# Patient Record
Sex: Male | Born: 1960
Health system: Southern US, Community
[De-identification: ages and names within clinical notes are randomized; demographics above are authoritative.]

## PROBLEM LIST (undated history)

## (undated) DIAGNOSIS — K219 Gastro-esophageal reflux disease without esophagitis: Secondary | ICD-10-CM

## (undated) DIAGNOSIS — M1990 Unspecified osteoarthritis, unspecified site: Secondary | ICD-10-CM

## (undated) DIAGNOSIS — D126 Benign neoplasm of colon, unspecified: Secondary | ICD-10-CM

## (undated) DIAGNOSIS — K649 Unspecified hemorrhoids: Secondary | ICD-10-CM

## (undated) DIAGNOSIS — K648 Other hemorrhoids: Secondary | ICD-10-CM

## (undated) DIAGNOSIS — K5790 Diverticulosis of intestine, part unspecified, without perforation or abscess without bleeding: Secondary | ICD-10-CM

## (undated) DIAGNOSIS — E785 Hyperlipidemia, unspecified: Secondary | ICD-10-CM

## (undated) DIAGNOSIS — K644 Residual hemorrhoidal skin tags: Secondary | ICD-10-CM

## (undated) DIAGNOSIS — I499 Cardiac arrhythmia, unspecified: Secondary | ICD-10-CM

## (undated) HISTORY — DX: Residual hemorrhoidal skin tags: K64.4

## (undated) HISTORY — DX: Cardiac arrhythmia, unspecified: I49.9

## (undated) HISTORY — DX: Diverticulosis of intestine, part unspecified, without perforation or abscess without bleeding: K57.90

## (undated) HISTORY — DX: Hyperlipidemia, unspecified: E78.5

## (undated) HISTORY — DX: Unspecified osteoarthritis, unspecified site: M19.90

## (undated) HISTORY — DX: Benign neoplasm of colon, unspecified: D12.6

## (undated) HISTORY — DX: Other hemorrhoids: K64.8

## (undated) HISTORY — DX: Gastro-esophageal reflux disease without esophagitis: K21.9

## (undated) HISTORY — DX: Unspecified hemorrhoids: K64.9

---

## 1994-12-22 HISTORY — PX: KNEE SURGERY: SHX244

## 1998-12-22 DIAGNOSIS — K649 Unspecified hemorrhoids: Secondary | ICD-10-CM

## 1998-12-22 HISTORY — DX: Unspecified hemorrhoids: K64.9

## 2000-01-09 ENCOUNTER — Encounter: Payer: Self-pay | Admitting: Emergency Medicine

## 2000-01-09 ENCOUNTER — Emergency Department (HOSPITAL_COMMUNITY): Admission: EM | Admit: 2000-01-09 | Discharge: 2000-01-09 | Payer: Self-pay | Admitting: Emergency Medicine

## 2001-04-08 ENCOUNTER — Emergency Department (HOSPITAL_COMMUNITY): Admission: EM | Admit: 2001-04-08 | Discharge: 2001-04-08 | Payer: Self-pay | Admitting: Emergency Medicine

## 2001-11-05 ENCOUNTER — Ambulatory Visit (HOSPITAL_COMMUNITY): Admission: RE | Admit: 2001-11-05 | Discharge: 2001-11-05 | Payer: Self-pay

## 2002-11-08 ENCOUNTER — Inpatient Hospital Stay (HOSPITAL_COMMUNITY): Admission: EM | Admit: 2002-11-08 | Discharge: 2002-11-10 | Payer: Self-pay | Admitting: Emergency Medicine

## 2002-11-08 ENCOUNTER — Encounter: Payer: Self-pay | Admitting: Emergency Medicine

## 2002-11-10 ENCOUNTER — Encounter: Payer: Self-pay | Admitting: Internal Medicine

## 2003-03-06 ENCOUNTER — Encounter: Payer: Self-pay | Admitting: Cardiology

## 2003-03-06 ENCOUNTER — Inpatient Hospital Stay (HOSPITAL_COMMUNITY): Admission: EM | Admit: 2003-03-06 | Discharge: 2003-03-07 | Payer: Self-pay | Admitting: Emergency Medicine

## 2003-03-07 ENCOUNTER — Encounter: Payer: Self-pay | Admitting: Cardiology

## 2003-07-21 ENCOUNTER — Emergency Department (HOSPITAL_COMMUNITY): Admission: EM | Admit: 2003-07-21 | Discharge: 2003-07-21 | Payer: Self-pay | Admitting: Emergency Medicine

## 2003-07-21 ENCOUNTER — Encounter: Payer: Self-pay | Admitting: Emergency Medicine

## 2003-07-21 ENCOUNTER — Encounter: Payer: Self-pay | Admitting: Cardiology

## 2004-10-23 ENCOUNTER — Ambulatory Visit: Payer: Self-pay | Admitting: Internal Medicine

## 2005-01-21 ENCOUNTER — Emergency Department (HOSPITAL_COMMUNITY): Admission: EM | Admit: 2005-01-21 | Discharge: 2005-01-21 | Payer: Self-pay | Admitting: Emergency Medicine

## 2005-02-17 ENCOUNTER — Ambulatory Visit: Payer: Self-pay | Admitting: Internal Medicine

## 2005-08-24 ENCOUNTER — Emergency Department (HOSPITAL_COMMUNITY): Admission: EM | Admit: 2005-08-24 | Discharge: 2005-08-24 | Payer: Self-pay | Admitting: Emergency Medicine

## 2005-12-22 DIAGNOSIS — I499 Cardiac arrhythmia, unspecified: Secondary | ICD-10-CM

## 2005-12-22 HISTORY — DX: Cardiac arrhythmia, unspecified: I49.9

## 2005-12-22 HISTORY — PX: CARDIOVERSION: SHX1299

## 2006-06-04 ENCOUNTER — Emergency Department (HOSPITAL_COMMUNITY): Admission: EM | Admit: 2006-06-04 | Discharge: 2006-06-04 | Payer: Self-pay | Admitting: Emergency Medicine

## 2006-10-16 ENCOUNTER — Emergency Department (HOSPITAL_COMMUNITY): Admission: EM | Admit: 2006-10-16 | Discharge: 2006-10-16 | Payer: Self-pay | Admitting: Emergency Medicine

## 2007-02-08 ENCOUNTER — Emergency Department (HOSPITAL_COMMUNITY): Admission: EM | Admit: 2007-02-08 | Discharge: 2007-02-08 | Payer: Self-pay | Admitting: Emergency Medicine

## 2007-12-01 ENCOUNTER — Emergency Department (HOSPITAL_COMMUNITY): Admission: EM | Admit: 2007-12-01 | Discharge: 2007-12-01 | Payer: Self-pay | Admitting: Emergency Medicine

## 2011-05-09 NOTE — H&P (Signed)
NAME:  Derek Bullock, Derek Bullock                       ACCOUNT NO.:  [id number]   MEDICAL RECORD NO.:  [id number]                   PATIENT TYPE:  INP   LOCATION:  1829                                 FACILITY:  [facility]   PHYSICIAN:  Cecil Cranker, M.D. [facility]         DATE OF BIRTH:  [birth year]   DATE OF ADMISSION:  11/08/2002  DATE OF DISCHARGE:                                HISTORY & PHYSICAL   HISTORY OF PRESENT ILLNESS:  Mr. Dirosa is a 50 year old, one and one-half  packs per day smoker with strong family history of coronary artery disease,  who presented with recurrent prolonged chest pain, palpitations, and  presyncope. The patient states that over the past few weeks, he has noted  recurrent palpitations from chest discomfort that is not necessarily  exertional. He noted prolonged chest pressure with precordial leads  associated with palpitations of about ten minutes associated with  presyncope. He also has some left thoracic pain. There is no history of  hypertension, diabetes mellitus, or hyperlipidemia.   ALLERGIES:  CODEINE.   MEDICATIONS:  None.   PAST MEDICAL HISTORY:  Left knee surgery.   SOCIAL HISTORY:  Lives in [place]. He is single. He is a Education administrator. Smokes  one to one-half packs of cigarettes per day. Drinks a six pack of beer two  to three times per week.   FAMILY HISTORY:  There is a very strong family history of coronary artery  disease. Grandfather died in his 45's of a heart attack. Father had coronary  artery disease. Father is 37 years of age and has had a permanent Pacemaker.   REVIEW OF SYSTEMS:  Unremarkable except as noted in the history of present  illness.   PHYSICAL EXAMINATION:  VITAL SIGNS: Blood pressure 114/69. Pulse 63,  respiratory rate 16, temperature 97.8.  GENERAL: In no distress.  HEENT: Unremarkable.  NECK: No bruits. No jugular venous distention.  LUNGS: Decreased breath sounds but no rales or rhonchi.  CARDIAC: No is a systolic  click but no murmur.  ABDOMEN: Unremarkable.  EXTREMITIES: Normal pulses bilaterally.   DIAGNOSTIC IMPRESSION:  Chest x-ray revealed no active disease. EKG reveals  possible septal myocardial infarction with biventricular conduction delay.   IMPRESSION:  1. Chest pain, palpitations and near syncope. Coronary artery disease to be     ruled out.  2. Possible old anterior septal myocardial infarction by EKG.  3. Tobacco abuse.  4. Early chronic obstructive pulmonary disease.   PLAN:  Will admit the patient. Treat with aspirin and beta blocker IV  Heparin. Serial enzymes and EKG's. Because of the symptoms of presyncope,  palpitations, chest pain and EKG suggesting possible old anterior septal  myocardial infarction, I suggested coronary angiography. The patient agrees  with this approach. I have discussed the risks with the patient and family.  Cecil Cranker, M.D. [facility]    EJL/MEDQ  D:  11/08/2002  T:  11/08/2002  Job:  [id number]

## 2011-05-09 NOTE — Consult Note (Signed)
NAME:  Derek Bullock, Derek Bullock                       ACCOUNT NO.:  [id number]   MEDICAL RECORD NO.:  [id number]                   PATIENT TYPE:  EMS   LOCATION:  MAJO                                 FACILITY:  [facility]   PHYSICIAN:  [place] Bing, M.D.               DATE OF BIRTH:  1961-09-18   DATE OF CONSULTATION:  07/21/2003  DATE OF DISCHARGE:  07/21/2003                                   CONSULTATION   PRIMARY CARDIOLOGIST:  Dr. Sherryl Manges.   HISTORY OF PRESENT ILLNESS:  A 50 year old gentleman referred for evaluation  of chest pain.  Derek Bullock was first seen by [place] 8 months ago when he was  admitted to [facility] with chest discomfort.  He underwent cardiac  catheterization at that time which demonstrated no coronary disease and  normal left ventricular systolic function.  He has had 1 or 2  echocardiograms since then, interpreted as either mild diffuse hypokinesis  or inferior wall hypokinesis.  He returned to the hospital in April of this  year with tachy palpitations and a brief syncopal spell.  ET evaluation  revealed a right atrial tachycardia that was apparently ablated.  He was  also started on beta blocker therapy.  He has had no recurrence of these  symptoms.   This morning, he noted the sudden onset of sharp left lateral chest  discomfort that is moderately severe.  There was radiation to the left upper  arm.  After a few minutes, the discomfort became dull and mild, its current  status.  He attempted no interventions to alleviate the discomfort.  There  is no association with movement.  He had brief lightheadedness but no  dyspnea, no diaphoresis.  He feels well at the present time.   PAST MEDICAL HISTORY:  Otherwise unremarkable.   ALLERGIES:  He has no true allergies but has had an adverse reaction to  CODEINE.   CURRENT MEDICATIONS:  Acebutelol, probably at a dose of 200 mg b.i.d.   SOCIAL HISTORY:  Longstanding history of cigarette smoking with a  total  consumption of approximately 30/pack/year.  Works as a Education administrator.  Has had  somewhat excessive use of alcohol but no true abuse syndrome.   FAMILY HISTORY:  Negative for coronary disease, but 2 family members have  had sudden death.   REVIEW OF SYSTEMS:  Chronic anxiety; intermittent arthralgias.  All other  systems negative.   PHYSICAL EXAMINATION:  Pleasant, slightly anxious appearing gentleman.  Temperature is 97, heart rate is 50 and regular, respirations 18, blood  pressure 120/70.  HEENT:  Anicteric sclerae.  NECK:  No jugular venous distention.  No carotid bruits.  ENDOCRINE:  No thyromegaly.  HEMATOPOIETIC:  No adenopathy.  SKIN:  No significant lesions.  LUNGS:  Clear.  CARDIAC:  Normal first and second heart sounds; 4th heart sound present.  ABDOMEN:  Soft and nontender; no organomegaly; no bruits.  EXTREMITIES:  No edema; normal distal pulses.  NEUROMUSCULAR:  Symmetric strength and tone.   LABORATORIES:  Initial basic laboratory studies are normal.  MB fractions  have ranged between 5.3 and 6.1.  Both Troponin and myoglobin have been  normal.  EKG:  Normal sinus bradycardial; delayed R-wave progression; rightward axis;  minimal nonspecific ST segment abnormalities; otherwise normal.   IMPRESSION:  Derek Bullock presents with really his first episode of chest  pain.  On review, his admission in November 2003 that led to cardiac  catheterization probably followed an episode of SVT.  With known coronary  arteries, the likelihood of a cardiac etiology is relatively low.  Coronary  spasm is a possibility.  Many other non-cardiac etiologies are at least as  important consideration.  A D-Dimer level, an echocardiogram are pending.  If negative, we will consider discharge from the emergency department with  close outpatient follow-up and sublingual nitroglycerine to be used on a  p.r.n. basis.  The patient will be carefully instructed to call us  immediately should  symptoms recur.                                               [place] Bing, M.D.    RR/MEDQ  D:  07/21/2003  T:  07/21/2003  Job:  [id number]

## 2011-05-09 NOTE — Consult Note (Signed)
NAME:  Derek Bullock, Derek Bullock                       ACCOUNT NO.:  [id number]   MEDICAL RECORD NO.:  [id number]                   PATIENT TYPE:  INP   LOCATION:  4731                                 FACILITY:  [facility]   PHYSICIAN:  Duke Salvia, M.D. [facility]           DATE OF BIRTH:  30-Jun-1961   DATE OF CONSULTATION:  03/06/2003  DATE OF DISCHARGE:                                   CONSULTATION   REASON FOR CONSULTATION:  Thank you very much for the privilege of seeing  this patient in consultation because of syncope.   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  They are now occurring about once a week.  They are strongly positive with diet and regularly associated with profound  post episode of fatigue.   This morning he was getting ready for work.  He had he same prodrome, felt  his heart pounding.  He could see it pounding through his chest that he  guessed at about 150-180 beats per minute.  The next thing he knew, he  awakened on the floor.  He remained lightheaded.  It persisted for about an  hour.  He called the office, and then came to the hospital.   Cardiac evaluation has included catheterization in November of this year  that was normal.  He has no hyperlipidemia, hypertension, or diabetes.  The  patient does smoke.  He does have a family history of heart disease as well  as a family history of sudden death.   SOCIAL HISTORY:  As outlined above.  He does use alcohol and a fair amount  of caffeine as well as tobacco.   REVIEW OF SYSTEMS:  Noted on the intake sheet dated today, and is not  recounted here.   PHYSICAL EXAMINATION:  GENERAL:  On examination, he is a middle-aged  Caucasian  male, in no acute distress.  VITAL SIGNS:  Blood pressure was 154/77, and at this point, it was noted as  131/75 with a pulse of 72.  HEENT:  Demonstrated no scleral icterus, no xanthomata.  The neck veins were  flat.  The carotids were brisk.  They were full bilaterally without bruits.  BACK:  Without kyphosis or scoliosis.  LUNGS:  Clear.  HEART:  Sounds were regular without murmurs or gallops.  ABDOMEN:  Soft with active bowel sounds, without no active pulsation, or  hepatomegaly.  PULSES:  Femoral pulses were 2+, distal pulses were intact.  EXTREMITIES:  There was no clubbing, cyanosis or edema.  NEUROLOGIC:  Exam was grossly normal.   STUDIES:  Electrocardiogram demonstrated sinus rhythm at 65, with intervals  of 0.15/0.09/0.38 with right  axis deviation and an R prime in lead V1.   IMPRESSION:  1. Recurrent tachy palpitations today with syncope.  2. Cardiac evaluation including:     A. Normal catheterization.     B. Normal ejection fraction.     C. Abnormal electrocardiogram as described above.  3. Social issues related to #1.   RECOMMENDATIONS:  We had a discussion regarding a conservative approach  which would be using an event recorder to try and clarify the mechanism of  this as an initial phase.  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  To this end, we discussed EPS and RF catheter ablation.  He is interested in proceeding with this.   In addition, however, given his abnormal electrocardiogram, we will obtain a  signal average ECG, as well, as consider flecainide infusion given the R  prime in lead V1.                                               Duke Salvia, M.D. [facility]    SCK/MEDQ  D:  03/06/2003  T:  03/07/2003  Job:  [id number]

## 2011-05-09 NOTE — H&P (Signed)
NAME:  Derek Bullock, Derek Bullock                       ACCOUNT NO.:  [id number]   MEDICAL RECORD NO.:  [id number]                   PATIENT TYPE:  INP   LOCATION:  1826                                 FACILITY:  [facility]   PHYSICIAN:  Salvadore Farber, M.D. [facility]         DATE OF BIRTH:  [birth year]   DATE OF ADMISSION:  03/06/2003  DATE OF DISCHARGE:                                HISTORY & PHYSICAL   CARDIOLOGIST:  Cecil Cranker, M.D. [facility]   PRIMARY CARE PHYSICIAN:  None.   CHIEF COMPLAINT:  Syncope.   HISTORY OF PRESENT ILLNESS:  The patient is a 50 year old gentleman with an  approximately six-month history of near-weekly tachypalpitations who  suffered syncope today.  He was hospitalized in November for evaluation of  these tachypalpitations.  That evaluation included an echocardiogram  demonstrating an EF of 45% with diffuse hypokinesis and no significant  valvular disease.  A cardiac catheterization performed also during that  hospitalization demonstrated normal coronary arteries, EF of 65%, and no  significant valvular disease.  He unfortunately failed to appear for a  planned outpatient evaluation including event monitor.   He has been feeling well of late.  This morning, he awoke continuing to feel  well.  While putting his clothes on, he noted tachypalpitations that lasted  for approximately 10 seconds before he abruptly lost consciousness.  He was  home alone and awoke lying on his back.  He suffered no injuries.  After  awakening, he had mild left-sided chest discomfort which subsequently  resolved.  While he has had presyncope with several prior episodes, this is  the first episode that he has suffered frank syncope.  He is now feeling  well.  The patient denies any exertional dyspnea, PND, orthopnea, edema.   PAST MEDICAL HISTORY:  None except as above and a prior knee surgery.   ALLERGIES:  CODEINE.   CURRENT MEDICATIONS:  None.   SOCIAL HISTORY:  The patient lives in  [place] alone.  He drinks 2-3  caffeinated beverages per day.  He smokes one pack a day of tobacco for 30  years.  He works as a Education administrator.  He drinks approximately a six-pack on  Thursday through Sunday evenings.   FAMILY HISTORY:  Mother is alive with diabetes.  Father is alive at 20 with  coronary disease and having had a permanent pacemaker.  A grandfather died  of unclear causes suddenly in his 30s.  An uncle died suddenly at age 70.   REVIEW OF SYSTEMS:  Negative in detail except as the above with the sole  exception of anxiety.   PHYSICAL EXAMINATION:  GENERAL:  This is a generally well-appearing thin man  in no distress.  VITAL SIGNS:  Heart rate 72, blood pressure 131/75, oxygen saturation of 99%  on room air.  NECK:  He has no jugular venous distention.  LUNGS:  Clear to auscultation and percussion bilaterally.  CARDIOVASCULAR:  He has a nondisplaced point of maximal cardiac impulse.  There is a regular rate and rhythm without murmur, rub, or gallop.  ABDOMEN:  Soft, nondistended, and nontender.  There is no  hepatosplenomegaly.  Bowel sounds are normal.  EXTREMITIES:  Warm without clubbing, cyanosis, edema, or ulceration. Carotid  pulses are 2+ bilaterally without bruits.  Femoral pulses are 2+ bilaterally  without bruits.  Dorsalis pedis pulses are 2+ bilaterally.   LABORATORY DATA:  Electrocardiogram demonstrates normal sinus rhythm with  normal intervals.  There is poor R-wave progression and J-point elevation in  V2-V4.  There is right axis deviation.  This electrocardiogram is unchanged  compared with one of November 08, 2002.   Laboratory studies are all pending at this time.   IMPRESSION AND RECOMMENDATIONS:  The patient had syncope in the setting of  clearcut arrhythmia as evidenced by his palpitations.  Prior cardiac  catheterization and echocardiogram differ as to whether his left ventricular  systolic function is normal.  He has no coronary or valvular  disease as  assessed in the November evaluation.  We will repeat echocardiogram to  assess left ventricular systolic function.  We will rule out myocardial  infarction though I think this is very unlikely.  We will observe on cardiac  monitor and have him assessed by electrophysiology service.                                               Salvadore Farber, M.D. [facility]    WED/MEDQ  D:  03/06/2003  T:  03/06/2003  Job:  [id number]

## 2011-05-09 NOTE — Discharge Summary (Signed)
NAME:  Derek Bullock, Derek Bullock                       ACCOUNT NO.:  [id number]   MEDICAL RECORD NO.:  [id number]                   PATIENT TYPE:  INP   LOCATION:  4731                                 FACILITY:  [facility]   PHYSICIAN:  Duke Salvia, M.D. [facility]           DATE OF BIRTH:  08-Apr-1961   DATE OF ADMISSION:  03/06/2003  DATE OF DISCHARGE:  03/07/2003                                 DISCHARGE SUMMARY   PRINCIPAL DIAGNOSIS:  Syncope.   HISTORY OF PRESENT ILLNESS:  This is a 50 year old gentleman with a past  medical history of chest pain.  In  November, 2003 he underwent a  catheterization which showed normal coronaries and an EF of 65%.  The  patient was admitted with tachy palpitations and chest pain.  He awoke  feeling well, developed tachy palpitations, became dizzy and woke up on the  floor.  Estimated onset of tachy palpitations plus syncope was approximately  10 seconds,  LOC for approximately 30 seconds to one minute.  Felt dizzy  upon awakening.  Has been having episodes of tachy palpitations for two to  three times per week for the past month.  Only other episode was in November  when he was admitted.  Denies any diaphoresis, positive shortness of breath  with episodes, positive chest pain or palpitations and slight chest pain  after a previous echocardiogram with an EF of 45%, hypokinesis in septal  distal, inferior distal, distal posterior and apical walls, stays  tachycardia, rhythm is irregular rhythm.  The patient was admitted and  underwent ED consultation.  He had a flecainide challenge and the flecainide  challenge was negative.  He underwent an EP study which showed inducible  right atrial tachycardia, questionable cause.  The patient was placed on  acebutolol 200 mg every 12 hours and recommended for a 30 day event  recorder.  The patient was discharged later that evening in stable condition  on acebutolol 200 mg every 12 hours, Tylenol one to two tablets every  four  to six hours as needed.   ACTIVITY:  No heavy lifting or strenuous activity for the next four days.  The patient was not to drive until seen by Dr. Graciela Husbands.   DIET:  Low fat, low salt, low cholesterol diet.   DISCHARGE INSTRUCTIONS:  He is to call if he develops a lump or any drainage  in his groin and he is to see Dr. Graciela Husbands on April 6 at 2:15 p.m.  The patient  stated that he did not have insurance or the funds to proceed with 30 day  event monitor.  This will be discussed with Dr. Graciela Husbands at his follow up  visit.     Chinita Pester, C.R.N.P. [facility]                 Duke Salvia, M.D. [facility]    DS/MEDQ  D:  03/07/2003  T:  03/08/2003  Job:  [id number]   cc:   Duke Salvia, M.D. [facility]

## 2011-05-09 NOTE — Discharge Summary (Signed)
NAME:  Derek Bullock, Derek Bullock                       ACCOUNT NO.:  [id number]   MEDICAL RECORD NO.:  [id number]                   PATIENT TYPE:  INP   LOCATION:  4731                                 FACILITY:  [facility]   PHYSICIAN:  Cecil Cranker, M.D. [facility]         DATE OF BIRTH:  [birth year]   DATE OF ADMISSION:  11/08/2002  DATE OF DISCHARGE:  11/10/2002                           DISCHARGE SUMMARY - REFERRING   PROCEDURE:  Coronary angiogram 11/09/02.   REASON FOR ADMISSION:  The patient is a 50 year old male, with no prior  history of heart disease, who presented to the emergency room with complaint  of progressive tachy palpitations (fluttering), associated with dyspnea  and near syncope.  Electrocardiogram on admission was suggestive of prior  anteroseptal myocardial infarction.  He was admitted for rule out of MI and  further diagnostic evaluation.  Please refer to dictated admission note for  full details.   LABORATORY DATA:  Peak CPK 343/15 (4.4%); troponin I 0.01 (x3).  Lipid  profile -- total cholesterol 141, triglyceride 48, HDL 63, LDL 58  (cholesterol/HDL ratio 2.2), TSH 2.72, alcohol level less than 5.  CBC  normal.  INR 0.8.  Sodium 141, potassium 4.2, glucose 89, BUN 13, creatinine  0.8.  Normal liver enzymes.  Negative urinalysis.   ADMISSION DIAGNOSIS:  No acute disease.   HOSPITAL COURSE:  The patient ruled out for myocardial infarction with  normal troponin I markers.  However, total CPK was elevated with BMB  fraction was only 4.4%.  In light of these findings, and the abnormal  electrocardiogram on admission, recommendation was to proceed with  diagnostic coronary angiography.   Cardiac catheterization performed the following day, by Dr. Andee Lineman (see  report for full details), revealed normal coronary arteries and normal left  ventricle.  Of note, Dr. Andee Lineman suggested that the EKG abnormalities could  be due to the vertical rotation of the patient's heart.   Subsequent workup consisted of a 2D echocardiogram (to be done at the time  of this dictation).  We will also pursue a complaint of tachy palpitations  and near syncope with an outpatient 30 day event monitor.   The patient was not on any medications prior to admission, and will be  discharged in a similar fashion.   MEDICATIONS AT DISCHARGE:  None.   INSTRUCTIONS:  No heavy lifting, driving x2 days; call the office if there  is any swelling/bleeding.   The patient is to stop smoking tobacco.   The patient will follow up with Dr. Andee Lineman in clinic on Thursday, 11/24/02 at  10:15 a.m.  Arrangements will be made through our office for placement of a  30 day event monitor.   DISCHARGE DIAGNOSES:  1. Noncardiac chest pain.     A. Elevated total CPK; normal troponin I markers.     B. Normal coronary angiogram.  2. Tachy palpitations.     A. Associated near syncope.  3. Tobacco.  4. Substance abuse.     Gene Serpe, P.A. [facility]                      E. Graceann Congress, M.D. [facility]    GS/MEDQ  D:  11/10/2002  T:  11/10/2002  Job:  [id number]

## 2011-05-09 NOTE — Cardiovascular Report (Signed)
NAME:  Derek Bullock, Derek Bullock                       ACCOUNT NO.:  [id number]   MEDICAL RECORD NO.:  [id number]                   PATIENT TYPE:  INP   LOCATION:  4731                                 FACILITY:  [facility]   PHYSICIAN:  Learta Codding, M.D. [facility]             DATE OF BIRTH:  [birth year]   DATE OF PROCEDURE:  11/09/2002  DATE OF DISCHARGE:                              CARDIAC CATHETERIZATION   PROCEDURE:  1. Left heart catheterization with selective coronary angiography.  2. Ventriculography.   DIAGNOSES:  1. No evidence for flow-limiting coronary artery disease.  2. Normal left ventricular systolic function.  3. No mitral regurgitation.   CARDIOLOGIST:  Learta Codding, M.D.   INDICATION:  The patient is a 50 year old male admitted with tachy  palpitations and substernal chest pain.  The patient had an abnormal  electrocardiogram suggestive of a septal wall MI.  He has been referred for  diagnostic cardiac catheterization to assess his coronary anatomy.   DESCRIPTION OF PROCEDURE:  After informed consent was obtained, the patient  was brought to the catheterization laboratory.  The right groin was  sterilely prepped and draped.  Lidocaine 1% was injected.  A 6-French  arterial sheath was placed using modified Seldinger technique.  Subsequently, 6-French JR4 and JL4 catheters were used to engage the left  and right coronary ostia, respectively.  Selective coronary angiography was  performed in various projections using manual injection of contrast.  A 6-  French angled pigtail catheter was placed in the left ventricular cavity.  Appropriate left-sided hemodynamics were also obtained.  Ventriculography  was performed in single plane ROA projection with power injection of  contrast.  At the termination of the procedure, all catheters and sheaths  were removed, and the patient was brought back to the holding area.  No  complications were encountered.  Adequate hemostasis was  provided.   FINDINGS:   HEMODYNAMICS:  Left ventricular pressure 101/9 mmHg, aortic pressure 101/75  mmHg.   VENTRICULOGRAPHY:  Ejection fraction 65% , no mitral regurgitation, no  segmental wall motion abnormality.   SELECTIVE CORONARY ANGIOGRAPHY:  1. The left main coronary artery was a large caliber vessel with no evidence     of flow-limiting disease.  2. The left anterior descending artery was a very large caliber vessel     wrapping around the entire apex and providing flow to the distal inferior     wall.  There was no evidence of flow-limiting coronary artery disease.  3. The circumflex coronary artery was small, but there was no evidence of     flow-limiting disease.  4. The right coronary artery was a very large caliber vessel terminating in     a posterior descending artery and four posterolateral branches that are     providing the entire lateral wall.    RECOMMENDATIONS:  No evidence of flow-limiting coronary artery disease.  Further workup for tachy palpitations.  EKG abnormalities may have been  explained by vertical rotation of patient's heart.                                                Learta Codding, M.D. [facility]    GED/MEDQ  D:  11/09/2002  T:  11/09/2002  Job:  [id number]   cc:   Kirkland Hun. Tery Sanfilippo, M.D.  [address]  [place] [address]  Fax: [phone]   E. Graceann Congress, M.D. [facility]

## 2011-09-29 LAB — BASIC METABOLIC PANEL
BUN: 8
CO2: 26
Creatinine, Ser: 0.8
GFR calc Af Amer: >60
GFR calc non Af Amer: >60
Potassium: 4.2
Sodium: 144

## 2011-09-29 LAB — DIFFERENTIAL
Basophils Relative: 0
Eosinophils Relative: 0
Lymphocytes Relative: 13
Lymphs Abs: 1.4
Monocytes Absolute: 0.5

## 2011-09-29 LAB — CBC
Hemoglobin: 14
MCHC: 35.2
RBC: 4.13 — ABNORMAL LOW
WBC: 10.8 — ABNORMAL HIGH

## 2012-09-22 ENCOUNTER — Encounter (HOSPITAL_COMMUNITY): Payer: Self-pay | Admitting: *Deleted

## 2012-09-22 ENCOUNTER — Emergency Department (HOSPITAL_COMMUNITY)
Admission: EM | Admit: 2012-09-22 | Discharge: 2012-09-22 | Disposition: A | Discharge status: Home / Self Care | Payer: Self-pay | Attending: Emergency Medicine | Admitting: Emergency Medicine

## 2012-09-22 DIAGNOSIS — K644 Residual hemorrhoidal skin tags: Secondary | ICD-10-CM

## 2012-09-22 DIAGNOSIS — Z885 Allergy status to narcotic agent status: Secondary | ICD-10-CM | POA: Insufficient documentation

## 2012-09-22 DIAGNOSIS — F17200 Nicotine dependence, unspecified, uncomplicated: Secondary | ICD-10-CM | POA: Insufficient documentation

## 2012-09-22 LAB — TYPE AND SCREEN
ABO/RH(D): A NEG
Antibody Screen: NEGATIVE

## 2012-09-22 LAB — COMPREHENSIVE METABOLIC PANEL
ALT: 14 U/L (ref 0–53)
AST: 21 U/L (ref 0–37)
CO2: 25 mEq/L (ref 19–32)
Potassium: 4.1 mEq/L (ref 3.5–5.1)

## 2012-09-22 LAB — URINALYSIS, ROUTINE W REFLEX MICROSCOPIC
Glucose, UA: NEGATIVE mg/dL
Ketones, ur: NEGATIVE mg/dL
Nitrite: NEGATIVE
Protein, ur: NEGATIVE mg/dL
pH: 6 (ref 5.0–8.0)

## 2012-09-22 LAB — PROTIME-INR
INR: 0.89 (ref 0.00–1.49)
Prothrombin Time: 12 seconds (ref 11.6–15.2)

## 2012-09-22 LAB — CBC WITH DIFFERENTIAL/PLATELET
Basophils Absolute: 0 10*3/uL (ref 0.0–0.1)
Basophils Relative: 0 % (ref 0–1)
Eosinophils Absolute: 0.1 10*3/uL (ref 0.0–0.7)
HCT: 42.3 % (ref 39.0–52.0)
Lymphs Abs: 2.4 10*3/uL (ref 0.7–4.0)
MCH: 33.8 pg (ref 26.0–34.0)
MCV: 97.2 fL (ref 78.0–100.0)
Monocytes Absolute: 0.8 10*3/uL (ref 0.1–1.0)
Platelets: 268 10*3/uL (ref 150–400)
RBC: 4.35 MIL/uL (ref 4.22–5.81)
RDW: 13.2 % (ref 11.5–15.5)

## 2012-09-22 LAB — APTT: aPTT: 27 seconds (ref 24–37)

## 2012-09-22 MED ORDER — DOCUSATE SODIUM 100 MG PO CAPS: 100.0000 mg | ORAL_CAPSULE | Freq: Two times a day (BID) | ORAL | Status: DC

## 2012-09-22 MED ORDER — PRAMOXINE HCL 1 % RE FOAM: RECTAL | Status: DC | PRN

## 2012-09-22 MED ORDER — SODIUM CHLORIDE 0.9 % IV SOLN
1000.0000 mL | INTRAVENOUS | Status: DC
  Administered 2012-09-22: 1000 mL via INTRAVENOUS

## 2012-09-22 MED ORDER — ONDANSETRON HCL 4 MG/2ML IJ SOLN
4.0000 mg | Freq: Once | INTRAMUSCULAR | Status: AC
  Administered 2012-09-22: 4 mg via INTRAVENOUS
  Filled 2012-09-22: qty 2

## 2012-09-22 NOTE — ED Provider Notes (Signed)
History     CSN: [id number]  Arrival date & time 09/22/12  1100   First MD Initiated Contact with Patient 09/22/12 1108      Chief Complaint  Patient presents with  . Rectal Bleeding     Patient is a 51 y.o. male presenting with hematochezia.  Rectal Bleeding  The current episode started today. The problem occurs frequently. The problem has been gradually worsening. The pain is mild. The stool is described as soft. Associated symptoms include anorexia and abdominal pain. Pertinent negatives include no fever, no vomiting and no difficulty breathing. There were no sick contacts.  Pt also feels something hanging out of this anus.  He has noticed dark blood and then bright blood at times.  He does have pain with bleeding.  He does not have a primary doctor and has not been able to see anyone for this problem.  The patient is mother states that in the past he did have hemorrhoidal banding but that was many years ago  History reviewed. No pertinent past medical history.  Past Surgical History  Procedure Date  . Knee surgery     No family history on file.  History  Substance Use Topics  . Smoking status: Current Every Day Smoker  . Smokeless tobacco: Not on file  . Alcohol Use: Yes     occ      Review of Systems  Constitutional: Negative for fever.  Gastrointestinal: Positive for abdominal pain, hematochezia and anorexia. Negative for vomiting.  All other systems reviewed and are negative.    Allergies  Codeine  Home Medications   None  BP 125/83  Pulse 113  Temp 98.1 F (36.7 C) (Oral)  Resp 18  SpO2 99%  Physical Exam  Nursing note and vitals reviewed. Constitutional: He appears well-developed and well-nourished. No distress.  HENT:  Head: Normocephalic and atraumatic.  Right Ear: External ear normal.  Left Ear: External ear normal.  Eyes: Conjunctivae normal are normal. Right eye exhibits no discharge. Left eye exhibits no discharge. No scleral icterus.    Neck: Neck supple. No tracheal deviation present.  Cardiovascular: Normal rate, regular rhythm and intact distal pulses.   Pulmonary/Chest: Effort normal and breath sounds normal. No stridor. No respiratory distress. He has no wheezes. He has no rales.  Abdominal: Soft. Bowel sounds are normal. He exhibits no distension. There is no tenderness. There is no rebound and no guarding.  Genitourinary:       External hemorrhoid, no active bleeding, no mass palpated on rectal exam  Musculoskeletal: He exhibits no edema and no tenderness.  Neurological: He is alert. He has normal strength. No sensory deficit. Cranial nerve deficit:  no gross defecits noted. He exhibits normal muscle tone. He displays no seizure activity. Coordination normal.  Skin: Skin is warm and dry. No rash noted.  Psychiatric: He has a normal mood and affect.    ED Course  Procedures (including critical care time)  Labs Reviewed  COMPREHENSIVE METABOLIC PANEL - Abnormal; Notable for the following:    Glucose, Bld 174 (*)     All other components within normal limits  URINALYSIS, ROUTINE W REFLEX MICROSCOPIC - Abnormal; Notable for the following:    Bilirubin Urine SMALL (*)     All other components within normal limits  CBC WITH DIFFERENTIAL  APTT  PROTIME-INR  TYPE AND SCREEN  OCCULT BLOOD, POC DEVICE  ABO/RH   No results found.   1. External hemorrhoid  MDM  Patient has an external hemorrhoid on exam. He has no evidence of active bleeding. I suspect that he is hemorrhoid is the most likely source of the intermittent bleeding he has had. However as I explained to the patient cannot exclude the possibility of some other etiology within the colon. He would benefit from an outpatient colonoscopy.         Celene Kras, MD 09/22/12 [phone]

## 2012-09-22 NOTE — ED Notes (Signed)
PT states that he has lower abdominal pain with some swelling and then rectal bleeding for one month that is getting worse

## 2012-09-22 NOTE — ED Notes (Signed)
Pt c/o lower abd pain along with rectal swelling and bleeding. Pt reports hx of hemorrhoids but reports this feels different. Pt reports sometimes he sees bright red blood other times it is darker, pt also reports he has experienced some blood clots. Pt reports he has also had a decreased appetite lately.

## 2012-10-04 ENCOUNTER — Emergency Department (HOSPITAL_COMMUNITY): Payer: Self-pay

## 2012-10-04 ENCOUNTER — Emergency Department (HOSPITAL_COMMUNITY)
Admission: EM | Admit: 2012-10-04 | Discharge: 2012-10-04 | Disposition: A | Discharge status: Home / Self Care | Payer: Self-pay | Attending: Emergency Medicine | Admitting: Emergency Medicine

## 2012-10-04 ENCOUNTER — Encounter (HOSPITAL_COMMUNITY): Payer: Self-pay | Admitting: Emergency Medicine

## 2012-10-04 DIAGNOSIS — F1010 Alcohol abuse, uncomplicated: Secondary | ICD-10-CM | POA: Insufficient documentation

## 2012-10-04 DIAGNOSIS — Z7982 Long term (current) use of aspirin: Secondary | ICD-10-CM | POA: Insufficient documentation

## 2012-10-04 DIAGNOSIS — K625 Hemorrhage of anus and rectum: Secondary | ICD-10-CM | POA: Insufficient documentation

## 2012-10-04 LAB — BASIC METABOLIC PANEL
Chloride: 101 mEq/L (ref 96–112)
Creatinine, Ser: 0.77 mg/dL (ref 0.50–1.35)
GFR calc non Af Amer: >90 mL/min (ref >90)
Glucose, Bld: 196 mg/dL — ABNORMAL HIGH (ref 70–99)
Potassium: 4.6 mEq/L (ref 3.5–5.1)

## 2012-10-04 LAB — CBC WITH DIFFERENTIAL/PLATELET
Eosinophils Relative: 1 % (ref 0–5)
HCT: 40.6 % (ref 39.0–52.0)
Hemoglobin: 14.3 g/dL (ref 13.0–17.0)
MCV: 98.8 fL (ref 78.0–100.0)
RBC: 4.11 MIL/uL — ABNORMAL LOW (ref 4.22–5.81)
RDW: 13.7 % (ref 11.5–15.5)

## 2012-10-04 LAB — HEPATIC FUNCTION PANEL
ALT: 15 U/L (ref 0–53)
AST: 23 U/L (ref 0–37)
Albumin: 3.8 g/dL (ref 3.5–5.2)
Alkaline Phosphatase: 59 U/L (ref 39–117)
Bilirubin, Direct: 0.1 mg/dL (ref 0.0–0.3)
Indirect Bilirubin: 0.3 mg/dL (ref 0.3–0.9)
Total Bilirubin: 0.4 mg/dL (ref 0.3–1.2)
Total Protein: 6.6 g/dL (ref 6.0–8.3)

## 2012-10-04 LAB — PROTIME-INR
INR: 0.93 (ref 0.00–1.49)
Prothrombin Time: 12.4 seconds (ref 11.6–15.2)

## 2012-10-04 LAB — APTT: aPTT: 27 seconds (ref 24–37)

## 2012-10-04 MED ORDER — IOHEXOL 300 MG/ML  SOLN
100.0000 mL | Freq: Once | INTRAMUSCULAR | Status: AC | PRN
  Administered 2012-10-04: 100 mL via INTRAVENOUS

## 2012-10-04 NOTE — ED Provider Notes (Signed)
CT results reviewed and discussed with Dr. Rulon Abide, and shared with patient and family--no acute intra-abdominal process noted--incidental finding of invaginated left ureterocele..  Patient is scheduled to start with the [facility] on November 1.  Anticipate referral to gastroenterology and urology once established.  Patient resting comfortably at present with family at bedside.  [name], NP 10/04/12 [phone]

## 2012-10-04 NOTE — ED Provider Notes (Signed)
History     CSN: [id number]  Arrival date & time 10/04/12  1143   First MD Initiated Contact with Patient 10/04/12 1330      Chief Complaint  Patient presents with  . Rectal Bleeding    (Consider location/radiation/quality/duration/timing/severity/associated sxs/prior treatment) HPI The patient presents with continued rectal bleeding. The patient has had this bleeding for the last 7 months. The patient states that he was seen here last week and several other times as well. The patient is heavy drinker. The patient states that he does not have constant bleeding but that he has it several times per day. The patient states that he has follow up with GI in a few weeks. The patient denies nausea, vomiting, weakness, chest pain, shortness of breath, headache, or diarrhea.  History reviewed. No pertinent past medical history.  Past Surgical History  Procedure Date  . Knee surgery     History reviewed. No pertinent family history.  History  Substance Use Topics  . Smoking status: Current Every Day Smoker  . Smokeless tobacco: Not on file  . Alcohol Use: Yes     occ      Review of Systems All other systems negative except as documented in the HPI. All pertinent positives and negatives as reviewed in the HPI.  Allergies  Codeine  Home Medications   Current Outpatient Rx  Name Route Sig Dispense Refill  . ASPIRIN 325 MG PO TABS Oral Take 325 mg by mouth daily.    Marland Kitchen DOCUSATE SODIUM 100 MG PO CAPS Oral Take 1 capsule (100 mg total) by mouth every 12 (twelve) hours. 60 capsule 0    BP 127/82  Pulse 80  Temp 98.5 F (36.9 C) (Oral)  Resp 16  SpO2 99%  Physical Exam  Constitutional: He appears well-developed and well-nourished. No distress.  HENT:  Head: Normocephalic and atraumatic.  Mouth/Throat: Oropharynx is clear and moist.  Cardiovascular: Normal rate, regular rhythm and normal heart sounds.  Exam reveals no gallop and no friction rub.   No murmur  heard. Pulmonary/Chest: Effort normal and breath sounds normal.  Abdominal: Soft. Normal appearance and bowel sounds are normal. There is no rigidity, no rebound and no guarding. No hernia.    Genitourinary: Prostate normal. Rectal exam shows external hemorrhoid.    ED Course  Procedures (including critical care time)  Labs Reviewed  BASIC METABOLIC PANEL - Abnormal; Notable for the following:    Glucose, Bld 196 (*)     All other components within normal limits  CBC WITH DIFFERENTIAL - Abnormal; Notable for the following:    RBC 4.11 (*)     MCH 34.8 (*)     All other components within normal limits  HEPATIC FUNCTION PANEL  APTT  PROTIME-INR     Patient to the CDU stable and CT scan due to his lower abdominal pain.  MDM  MDM Reviewed: vitals, nursing note and previous chart Reviewed previous: labs Interpretation: labs            Carlyle Dolly, PA-C 10/04/12 1544

## 2012-10-04 NOTE — ED Provider Notes (Signed)
Medical screening examination/treatment/procedure(s) were performed by non-physician practitioner and as supervising physician I was immediately available for consultation/collaboration.  Jones Skene, M.D.      Jones Skene, MD 10/04/12 2354

## 2012-10-04 NOTE — ED Notes (Signed)
Patient drinking CT contrast

## 2012-10-04 NOTE — ED Notes (Signed)
Pt sts lower abd pain with rectal bleeding x several days; pt sts seen here last week for same

## 2012-10-04 NOTE — ED Notes (Signed)
Patient transported to CT 

## 2012-10-05 NOTE — ED Provider Notes (Signed)
Medical screening examination/treatment/procedure(s) were performed by non-physician practitioner and as supervising physician I was immediately available for consultation/collaboration.   Chance Karam B. Ladarious Kresse, MD 10/05/12 1014 

## 2012-11-16 ENCOUNTER — Ambulatory Visit (INDEPENDENT_AMBULATORY_CARE_PROVIDER_SITE_OTHER): Payer: Self-pay | Admitting: Family Medicine

## 2012-11-16 ENCOUNTER — Encounter: Payer: Self-pay | Admitting: Family Medicine

## 2012-11-16 VITALS — BP 134/87 | HR 70 | Ht 73.0 in | Wt 146.7 lb

## 2012-11-16 DIAGNOSIS — F17200 Nicotine dependence, unspecified, uncomplicated: Secondary | ICD-10-CM

## 2012-11-16 DIAGNOSIS — Z720 Tobacco use: Secondary | ICD-10-CM

## 2012-11-16 DIAGNOSIS — F1010 Alcohol abuse, uncomplicated: Secondary | ICD-10-CM

## 2012-11-16 DIAGNOSIS — K625 Hemorrhage of anus and rectum: Secondary | ICD-10-CM

## 2012-11-16 NOTE — Patient Instructions (Addendum)
Thank you for coming in today, it was very nice to meet you As we discussed try to slowly cut back on your alcohol intake Continue stool softeners and fiber supplementation to see if this improves your rectal bleeding.  Once you have the orange card please call and i will place a referral to [facility] for you to have a colonoscopy.   Alcohol Problems Most adults who drink alcohol drink in moderation (not a lot) are at low risk for developing problems related to their drinking. However, all drinkers, including low-risk drinkers, should know about the health risks connected with drinking alcohol. RECOMMENDATIONS FOR LOW-RISK DRINKING  Drink in moderation. Moderate drinking is defined as follows:   Men - no more than 2 drinks per day.  Nonpregnant women - no more than 1 drink per day.  Over age 30 - no more than 1 drink per day. A standard drink is 12 grams of pure alcohol, which is equal to a 12 ounce bottle of beer or wine cooler, a 5 ounce glass of wine, or 1.5 ounces of distilled spirits (such as whiskey, brandy, vodka, or rum).  ABSTAIN FROM (DO NOT DRINK) ALCOHOL:  When pregnant or considering pregnancy.  When taking a medication that interacts with alcohol.  If you are alcohol dependent.  A medical condition that prohibits drinking alcohol (such as ulcer, liver disease, or heart disease). DISCUSS WITH YOUR CAREGIVER:  If you are at risk for coronary heart disease, discuss the potential benefits and risks of alcohol use: Light to moderate drinking is associated with lower rates of coronary heart disease in certain populations (for example, men over age 11 and postmenopausal women). Infrequent or nondrinkers are advised not to begin light to moderate drinking to reduce the risk of coronary heart disease so as to avoid creating an alcohol-related problem. Similar protective effects can likely be gained through proper diet and exercise.  Women and the elderly have smaller amounts of  body water than men. As a result women and the elderly achieve a higher blood alcohol concentration after drinking the same amount of alcohol.  Exposing a fetus to alcohol can cause a broad range of birth defects referred to as Fetal Alcohol Syndrome (FAS) or Alcohol-Related Birth Defects (ARBD). Although FAS/ARBD is connected with excessive alcohol consumption during pregnancy, studies also have reported neurobehavioral problems in infants born to mothers reporting drinking an average of 1 drink per day during pregnancy.  Heavier drinking (the consumption of more than 4 drinks per occasion by men and more than 3 drinks per occasion by women) impairs learning (cognitive) and psychomotor functions and increases the risk of alcohol-related problems, including accidents and injuries. CAGE QUESTIONS:   Have you ever felt that you should Cut down on your drinking?  Have people Annoyed you by criticizing your drinking?  Have you ever felt bad or Guilty about your drinking?  Have you ever had a drink first thing in the morning to steady your nerves or get rid of a hangover (Eye opener)? If you answered positively to any of these questions: You may be at risk for alcohol-related problems if alcohol consumption is:   Men: Greater than 14 drinks per week or more than 4 drinks per occasion.  Women: Greater than 7 drinks per week or more than 3 drinks per occasion. Do you or your family have a medical history of alcohol-related problems, such as:  Blackouts.  Sexual dysfunction.  Depression.  Trauma.  Liver dysfunction.  Sleep disorders.  Hypertension.  Chronic abdominal pain.  Has your drinking ever caused you problems, such as problems with your family, problems with your work (or school) performance, or accidents/injuries?  Do you have a compulsion to drink or a preoccupation with drinking?  Do you have poor control or are you unable to stop drinking once you have started?  Do you  have to drink to avoid withdrawal symptoms?  Do you have problems with withdrawal such as tremors, nausea, sweats, or mood disturbances?  Does it take more alcohol than in the past to get you high?  Do you feel a strong urge to drink?  Do you change your plans so that you can have a drink?  Do you ever drink in the morning to relieve the shakes or a hangover? If you have answered a number of the previous questions positively, it may be time for you to talk to your caregivers, family, and friends and see if they think you have a problem. Alcoholism is a chemical dependency that keeps getting worse and will eventually destroy your health and relationships. Many alcoholics end up dead, impoverished, or in prison. This is often the end result of all chemical dependency.  Do not be discouraged if you are not ready to take action immediately.  Decisions to change behavior often involve up and down desires to change and feeling like you cannot decide.  Try to think more seriously about your drinking behavior.  Think of the reasons to quit. WHERE TO GO FOR ADDITIONAL INFORMATION   The National Institute on Alcohol Abuse and Alcoholism (NIAAA)www.niaaa.nih.gov  [employer] on Alcoholism and Drug Dependence (NCADD)www.ncadd.org  American Society of Addiction Medicine (ASAM)www.https://anderson-johnson.com/ Document Released: 12/08/2005 Document Revised: 03/01/2012 Document Reviewed: 07/26/2008 [facility] Patient Information 2013 [place], [place].

## 2012-11-22 DIAGNOSIS — Z720 Tobacco use: Secondary | ICD-10-CM | POA: Insufficient documentation

## 2012-11-22 DIAGNOSIS — K625 Hemorrhage of anus and rectum: Secondary | ICD-10-CM | POA: Insufficient documentation

## 2012-11-22 DIAGNOSIS — F1010 Alcohol abuse, uncomplicated: Secondary | ICD-10-CM | POA: Insufficient documentation

## 2012-11-22 HISTORY — DX: Tobacco use: Z72.0

## 2012-11-22 NOTE — Progress Notes (Signed)
  Subjective:    Patient ID: Derek Bullock, male    DOB: [birth year], 51 y.o.   MRN: [id number]  HPI Pt here as new patient to establish care and to discuss  1. Rectal bleeding:  Reports rectal bleeding off and on over the last 8 months.  Has been seen in the ED and found to have external hemorrhoids and referred to GI.  He has not been seen by GI at this point.  He does endorse undesired weight loss of ~20-30 lbs over a 2 month period.  Using colace to keep BM soft. He denies abdominal pain, nausea, vomiting.    2. EtOH abuse:  His mother who acompanies him is concerned about his EtOH abuse.  Typically drinks 3-4 beers per night (24 oz beers).  He does not feel that he has a problem with drinking.  He has cut back in the past and does reports some "shakes" when trying to cut back.    Review of Systems Per HPI    Objective:   Physical Exam  Constitutional: No distress.       Thin male  HENT:  Head: Normocephalic and atraumatic.  Eyes: Conjunctivae normal are normal. No scleral icterus.  Neck: Neck supple. No thyromegaly present.  Cardiovascular: Normal rate, regular rhythm and normal heart sounds.   Pulmonary/Chest: Effort normal and breath sounds normal.  Abdominal: Soft. Bowel sounds are normal. He exhibits no distension. There is no tenderness.  Genitourinary:       Declined rectal   Musculoskeletal: Normal range of motion. He exhibits no edema.  Neurological: He is alert.          Assessment & Plan:

## 2012-11-22 NOTE — Assessment & Plan Note (Signed)
Counseled to quit 

## 2012-11-22 NOTE — Assessment & Plan Note (Signed)
CAGE criteria positive.  Did not want to discuss a whole lot today but briefly reviewed strategies to reduce EtOH use, advised not to stop abruptly.  Follow up once he has assistance card

## 2012-11-22 NOTE — Assessment & Plan Note (Addendum)
Possibly due to external hemorrhoids that were seen on previous exam.  Declined rectal today. Rectal bleeding with weight loss most concerning for malignancy.  He Is agreeable to referral to [facility] for colonoscopy once he has assistance card. Advised to call once he has this in place so I could go ahead and place referral.

## 2013-07-21 ENCOUNTER — Ambulatory Visit: Payer: Self-pay | Admitting: Family Medicine

## 2013-09-25 IMAGING — CT CT ABD-PELV W/ CM
2 of 5 series · 17 of 46 positions shown, 19 images · IV contrast (APPLIED)
Comparison: None.

CLINICAL DATA: Abdominal pain, rectal bleeding

CT ABDOMEN AND PELVIS WITH CONTRAST
TECHNIQUE: Multidetector CT imaging of the abdomen and pelvis was
performed following the standard protocol during bolus
administration of intravenous contrast.
Contrast: 100mL OMNIPAQUE IOHEXOL 300 MG/ML  SOLN in

[Series 2: abd/pelv with 5.0 b31f st · axial · 0.67mm/px · z∈[-574,-194]mm · 14 of 86 slices shown, 16 images]
[im 5/86  soft-tissue]
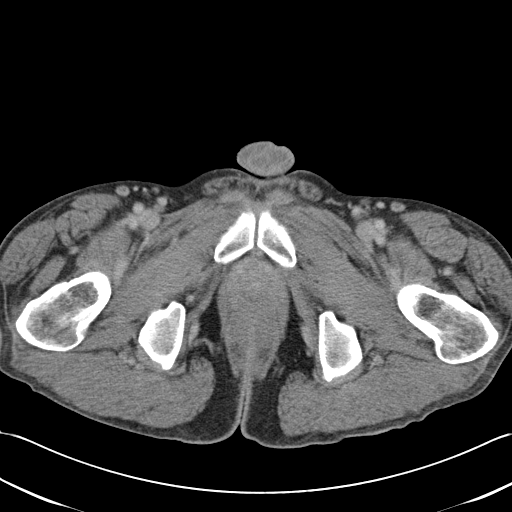
[im 5/86  bone]
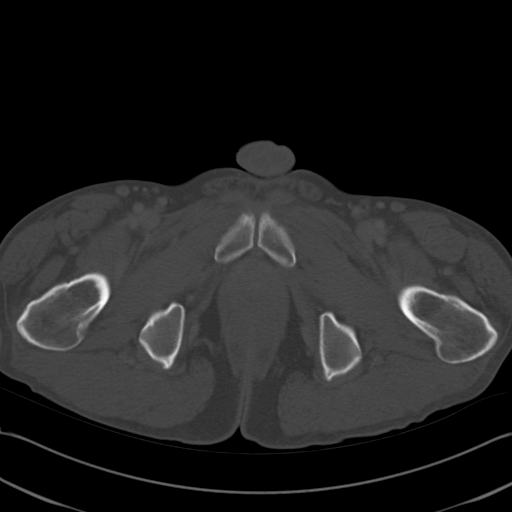
[im 9/86  soft-tissue]
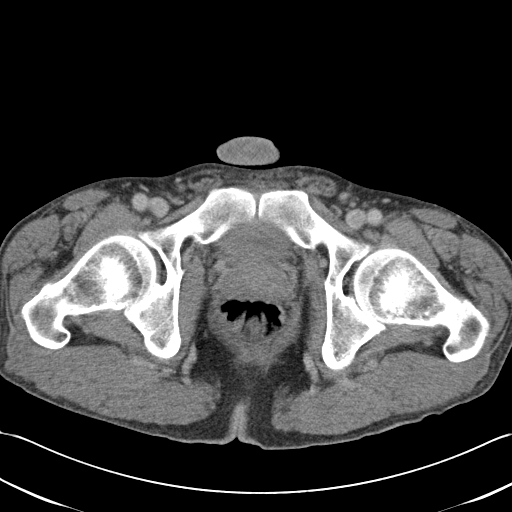
[im 18/86  soft-tissue]
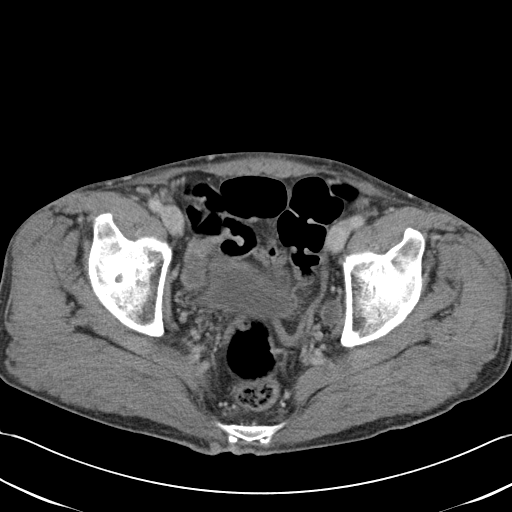
[im 23/86  soft-tissue]
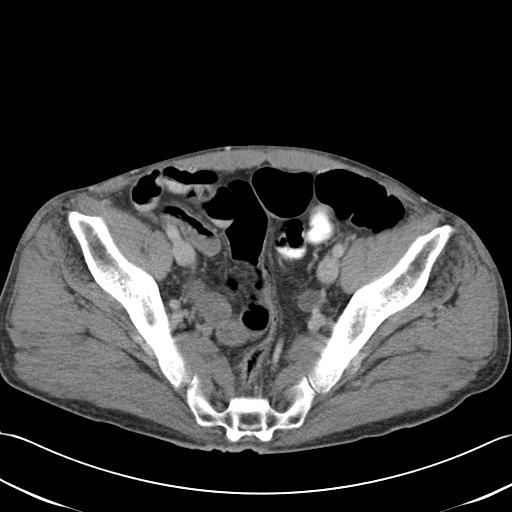
[im 27/86  soft-tissue]
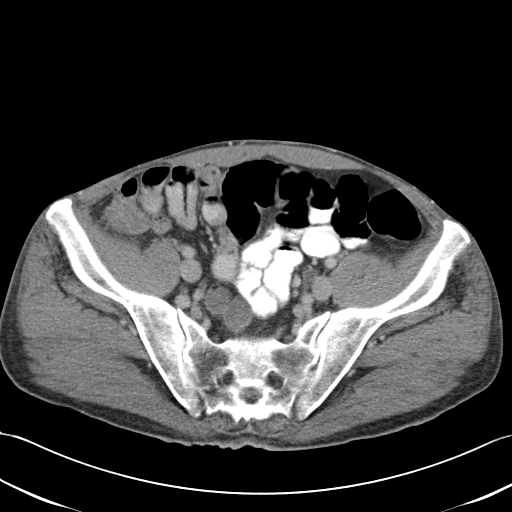
[im 36/86  soft-tissue]
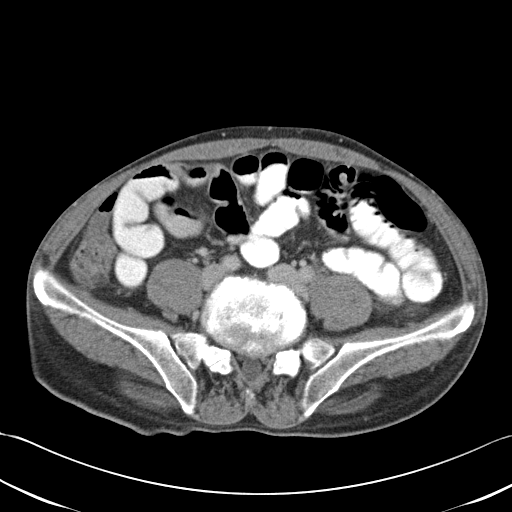
[im 41/86  soft-tissue]
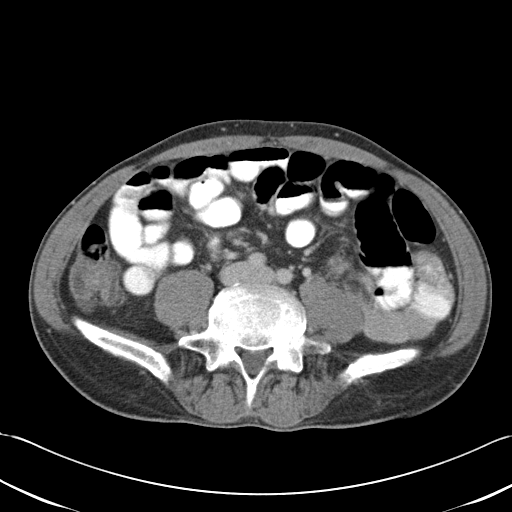
[im 45/86  soft-tissue]
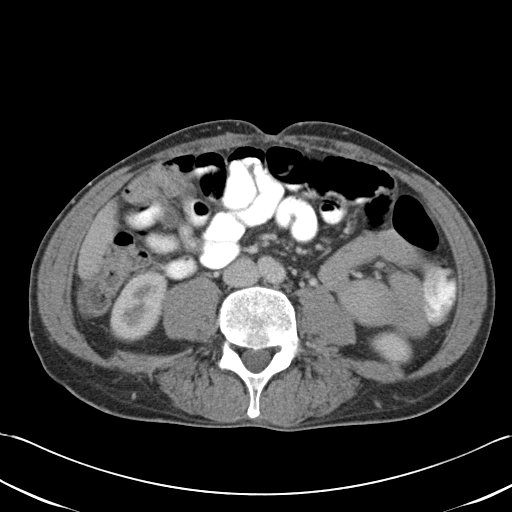
[im 50/86  soft-tissue]
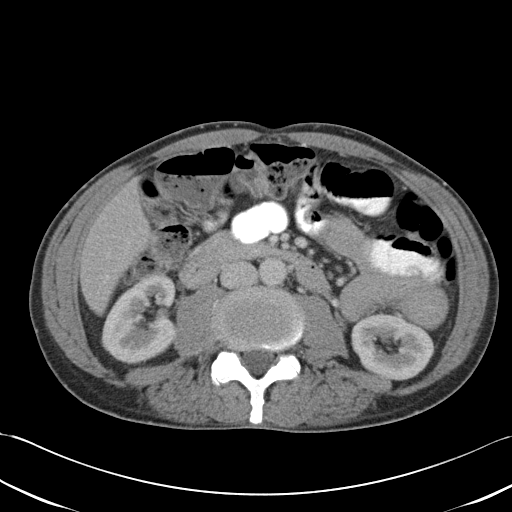
[im 50/86  bone]
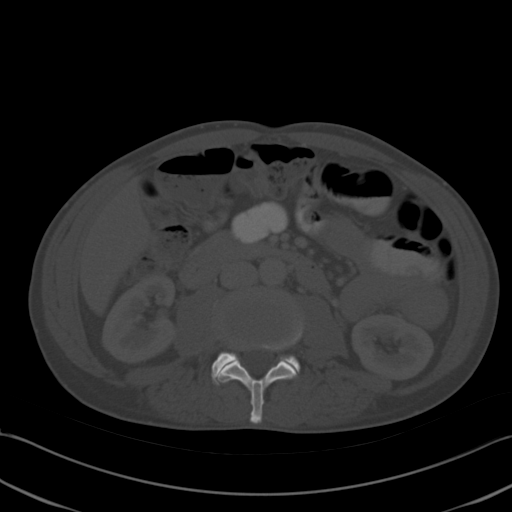
[im 59/86  soft-tissue]
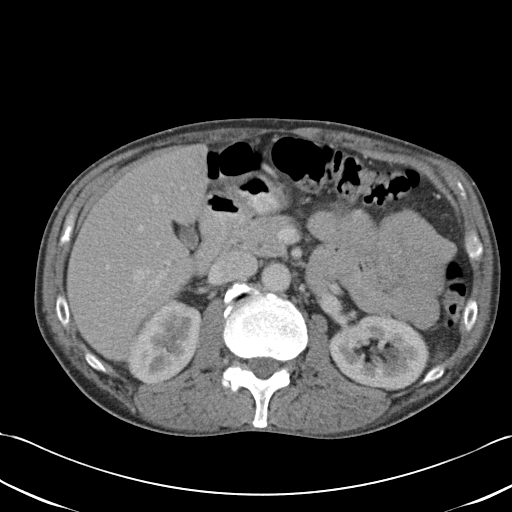
[im 63/86  soft-tissue]
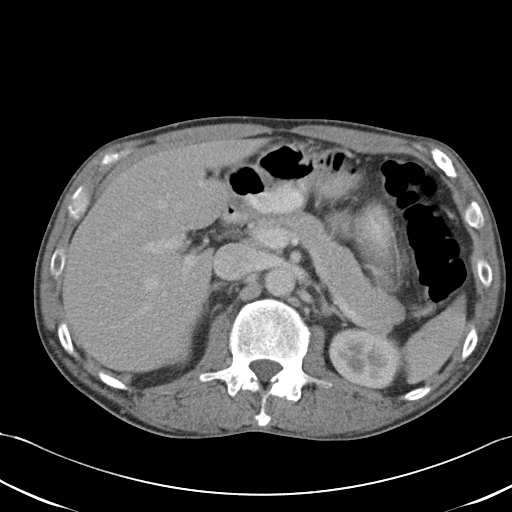
[im 68/86  soft-tissue]
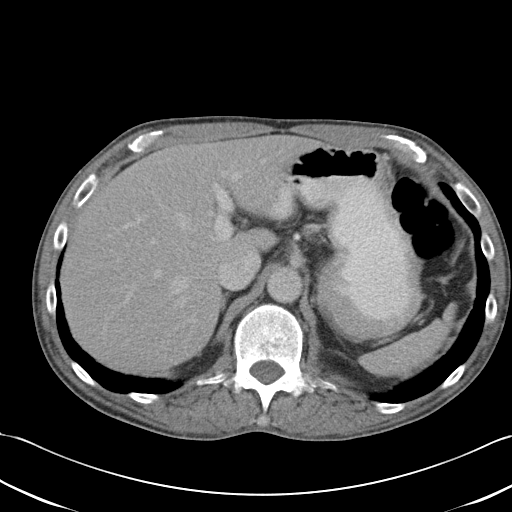
[im 77/86  soft-tissue]
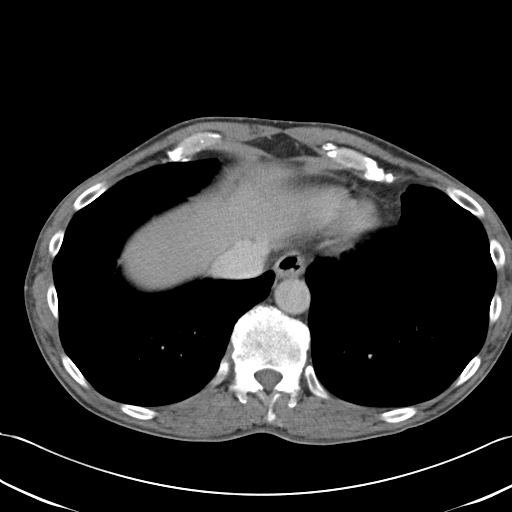
[im 81/86  soft-tissue]
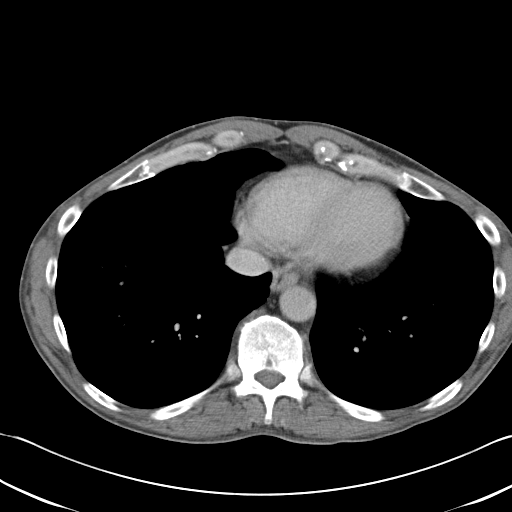

[Series 602: cor · coronal · 0.83mm/px · 3 of 66 slices shown]
[im 22/66  soft-tissue]
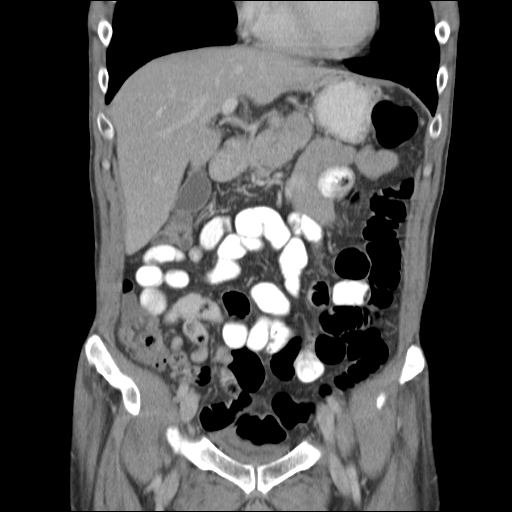
[im 29/66  soft-tissue]
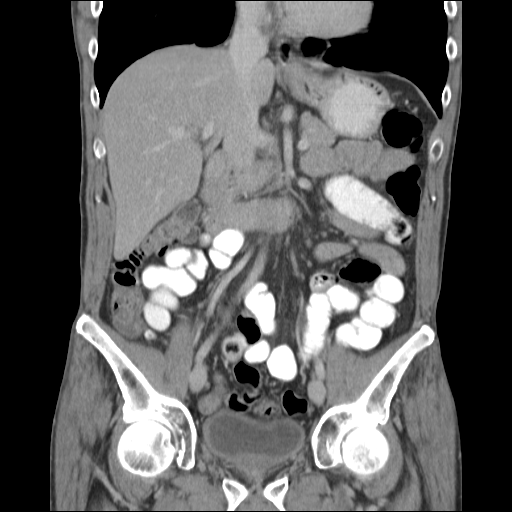
[im 37/66  soft-tissue]
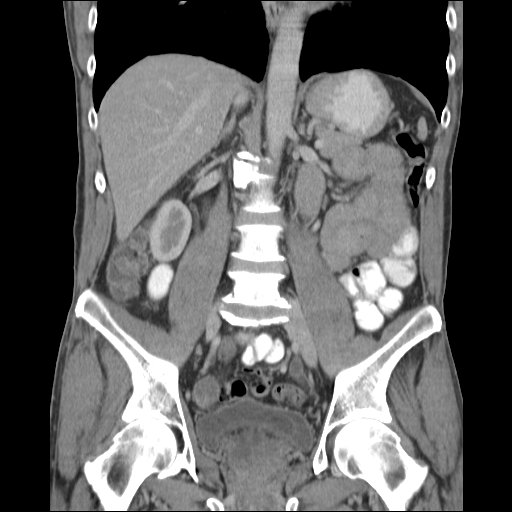

[17 of 46 positions shown; findings below may reference images not displayed]

FINDINGS: Lung bases clear.  Normal heart size.  No pericardial or
pleural effusion.  No hiatal hernia.

Abdomen:  Liver, gallbladder, biliary system, pancreas, spleen,
adrenal glands, and kidneys are within normal limits and
demonstrate no acute finding.  No obstruction or hydronephrosis.

Negative for bowel obstruction, dilatation, ileus pattern, or free
air.

No abdominal free fluid, fluid collection, hemorrhage, abscess, or
adenopathy.

Pelvis:  Left distal ureter is mildly dilated extending to the UVJ,
where there appears to be an invaginated ureterocele measuring
cm extending into the bladder lumen, image 72.  No associated
ureteral duplication identified or obstructing urinary tract
calculus.

No pelvic free fluid, fluid collection, hemorrhage, hematoma,
adenopathy, or inguinal abnormality.

Degenerative changes of the spine diffusely.  No compression
fracture.
IMPRESSION: No acute intra-abdominal pelvic process demonstrated.

Invaginating left ureterocele with mild left distal hydroureter but
no obstructive uropathy.  This likely is an incidental finding.

## 2015-04-30 ENCOUNTER — Emergency Department (HOSPITAL_COMMUNITY)
Admission: EM | Admit: 2015-04-30 | Discharge: 2015-04-30 | Discharge status: Against Medical Advice | Payer: Self-pay | Attending: Emergency Medicine | Admitting: Emergency Medicine

## 2015-04-30 ENCOUNTER — Encounter (HOSPITAL_COMMUNITY): Payer: Self-pay | Admitting: Cardiology

## 2015-04-30 DIAGNOSIS — N63 Unspecified lump in breast: Secondary | ICD-10-CM | POA: Insufficient documentation

## 2015-04-30 DIAGNOSIS — Z720 Tobacco use: Secondary | ICD-10-CM | POA: Insufficient documentation

## 2015-04-30 NOTE — ED Notes (Signed)
Pt reports that he noticed a lump under the right breast about a week ago. Pt reports the area is tender,.

## 2015-04-30 NOTE — ED Notes (Signed)
Pt updated on longest wait time, pt states, "I am not able to stay."

## 2015-05-01 ENCOUNTER — Encounter (HOSPITAL_COMMUNITY): Payer: Self-pay | Admitting: *Deleted

## 2015-05-01 ENCOUNTER — Emergency Department (HOSPITAL_COMMUNITY)
Admission: EM | Admit: 2015-05-01 | Discharge: 2015-05-01 | Disposition: A | Discharge status: Home / Self Care | Payer: Self-pay | Attending: Emergency Medicine | Admitting: Emergency Medicine

## 2015-05-01 DIAGNOSIS — N63 Unspecified lump in unspecified breast: Secondary | ICD-10-CM

## 2015-05-01 DIAGNOSIS — Z720 Tobacco use: Secondary | ICD-10-CM | POA: Insufficient documentation

## 2015-05-01 DIAGNOSIS — Z7982 Long term (current) use of aspirin: Secondary | ICD-10-CM | POA: Insufficient documentation

## 2015-05-01 NOTE — ED Notes (Signed)
Pt in c/o lump noted immediately under his right nipple x1 month, denies discharge from nipple, no distress noted, no redness noted

## 2015-05-01 NOTE — Discharge Instructions (Signed)
Follow up with Dr. Donne Hazel for further evaluation of your breast mass. Call the office today for an appointment.

## 2015-05-01 NOTE — ED Provider Notes (Signed)
CSN: [id number]     Arrival date & time 05/01/15  1050 History   This chart was scribed for Alvina Chou, PA-C working with Milton Ferguson, MD by Randa Evens, ED Scribe. This patient was seen in room TR11C/TR11C and the patient's care was started at 11:58 AM.    Chief Complaint  Patient presents with  . Breast Mass   The history is provided by the patient. No language interpreter was used.   HPI Comments: Derek Bullock is a 54 y.o. male who presents to the Emergency Department complaining of worsening breast mass under the right breast that began 1 month prior. Pt states that he lived at [place] in [phone] and was recently told that the water there was contaminated and caused breast cancer in males. Pt doesn't report any alleviating or worsening factors. Pt doesn't report any treatments tried PTA. Pt denies redness, warmth or discharge.    History reviewed. No pertinent past medical history. Past Surgical History  Procedure Laterality Date  . Knee surgery     History reviewed. No pertinent family history. History  Substance Use Topics  . Smoking status: Current Every Day Smoker -- 1.00 packs/day    Types: Cigarettes  . Smokeless tobacco: Not on file  . Alcohol Use: Yes     Comment: occ    Review of Systems  Respiratory:       Right sided breast mass.   All other systems reviewed and are negative.    Allergies  Codeine  Home Medications   Prior to Admission medications   Medication Sig Start Date End Date Taking? Authorizing Provider  aspirin 325 MG tablet Take 325 mg by mouth daily.    Historical Provider, MD  docusate sodium (COLACE) 100 MG capsule Take 1 capsule (100 mg total) by mouth every 12 (twelve) hours. 09/22/12   Dorie Rank, MD   BP 137/83 mmHg  Pulse 85  Temp(Src) 98.4 F (36.9 C) (Oral)  Resp 17  Ht 6\' 1"  (1.854 m)  Wt 155 lb (70.308 kg)  BMI 20.45 kg/m2  SpO2 98%   Physical Exam  Constitutional: He is oriented to person, place, and  time. He appears well-developed and well-nourished. No distress.  HENT:  Head: Normocephalic and atraumatic.  Eyes: Conjunctivae and EOM are normal.  Neck: Normal range of motion. Neck supple. No tracheal deviation present.  Cardiovascular: Normal rate.   Pulmonary/Chest: Effort normal. No respiratory distress. Right breast exhibits mass.  1.5 cm x 1.5 cm hard non tender mass just below the right nipple.  Abdominal: Soft. He exhibits no distension. There is no tenderness. There is no rebound.  Musculoskeletal: Normal range of motion.  Neurological: He is alert and oriented to person, place, and time. Coordination normal.  Skin: Skin is warm and dry.  Psychiatric: He has a normal mood and affect. His behavior is normal.  Nursing note and vitals reviewed.   ED Course  Procedures (including critical care time) DIAGNOSTIC STUDIES: Oxygen Saturation is 98% on RA, normal by my interpretation.    COORDINATION OF CARE: 12:07 PM-Discussed treatment plan with pt at bedside and pt agreed to plan.     Labs Review Labs Reviewed - No data to display  Imaging Review No results found.   EKG Interpretation None      MDM   Final diagnoses:  Breast mass in male    1:20 PM Patient will be referred to breast center for further evaluation. Vitals stable and patient afebrile.  I personally performed the services described in this documentation, which was scribed in my presence. The recorded information has been reviewed and is accurate.      Alvina Chou, PA-C 05/01/15 1321  Milton Ferguson, MD 05/01/15 1424

## 2015-05-01 NOTE — ED Notes (Signed)
Pt has 2 cm nodule under right nipple. Onset 1 month ago. States it is mildly tender to touch but if he "hits it, it hurts really bad". No drainage from nipple.

## 2015-05-11 ENCOUNTER — Encounter: Payer: Self-pay | Admitting: Family Medicine

## 2015-05-11 ENCOUNTER — Ambulatory Visit: Payer: Self-pay | Attending: Family Medicine | Admitting: Family Medicine

## 2015-05-11 VITALS — BP 129/86 | HR 80 | Temp 98.5°F | Resp 16 | Ht 72.0 in | Wt 148.0 lb

## 2015-05-11 DIAGNOSIS — N6310 Unspecified lump in the right breast, unspecified quadrant: Secondary | ICD-10-CM

## 2015-05-11 DIAGNOSIS — N62 Hypertrophy of breast: Secondary | ICD-10-CM

## 2015-05-11 DIAGNOSIS — K649 Unspecified hemorrhoids: Secondary | ICD-10-CM

## 2015-05-11 DIAGNOSIS — Z114 Encounter for screening for human immunodeficiency virus [HIV]: Secondary | ICD-10-CM

## 2015-05-11 DIAGNOSIS — Z720 Tobacco use: Secondary | ICD-10-CM

## 2015-05-11 DIAGNOSIS — N63 Unspecified lump in breast: Secondary | ICD-10-CM

## 2015-05-11 HISTORY — DX: Hypertrophy of breast: N62

## 2015-05-11 LAB — COMPLETE METABOLIC PANEL WITH GFR
ALBUMIN: 4.3 g/dL (ref 3.5–5.2)
ALT: 19 U/L (ref 0–53)
AST: 23 U/L (ref 0–37)
Alkaline Phosphatase: 69 U/L (ref 39–117)
BUN: 10 mg/dL (ref 6–23)
CALCIUM: 10.1 mg/dL (ref 8.4–10.5)
CHLORIDE: 104 meq/L (ref 96–112)
CO2: 27 meq/L (ref 19–32)
Creat: 0.86 mg/dL (ref 0.50–1.35)
GFR, Est African American: >89 mL/min
GLUCOSE: 86 mg/dL (ref 70–99)
Potassium: 5.2 mEq/L (ref 3.5–5.3)
Sodium: 141 mEq/L (ref 135–145)
TOTAL PROTEIN: 7 g/dL (ref 6.0–8.3)
Total Bilirubin: 0.5 mg/dL (ref 0.2–1.2)

## 2015-05-11 MED ORDER — MAGNESIUM HYDROXIDE 400 MG/5ML PO SUSP: 15.0000 mL | Freq: Every day | ORAL | Status: DC | PRN

## 2015-05-11 NOTE — Progress Notes (Signed)
Subjective:    Patient ID: Derek Bullock, male    DOB: 1961/06/16, 54 y.o.   MRN: [id number] CC: ED f/u, establish care, breast mass  HPI  1. R breast mass: x 1 month. Under nipple. Tender to palpation. Otherwise non tender. No nipple discharge. ROS as per below. Patient is a heavy smoker. Patient is a heavy drinker of beer.   2. Hemorrhoid: patient with pain after BMs with blood mixed with stool. Has hx of hemorrhoid banding. Banding helped for many years but pain and blood returned 2+ years ago.   History   Social History  . Marital Status: Divorced    Spouse Name: N/A  . Number of Children: 0  . Years of Education: 11   Occupational History  . Unemployed    Social History Main Topics  . Smoking status: Current Every Day Smoker -- 1.00 packs/day for 12 years    Types: Cigarettes  . Smokeless tobacco: Never Used  . Alcohol Use: Yes     Comment: 80 0z of beer 3 times a week   . Drug Use: No  . Sexual Activity: Not on file   Other Topics Concern  . Not on file   Social History Narrative   Mom and brother    Fam Hx: heart disease in mother and father Med Hx: hemorrhoid s/p banding, arrythmia s/p cardioversion   Review of Systems  Constitutional: Negative for fever, chills, diaphoresis, activity change, appetite change, fatigue and unexpected weight change.       Patient denies weight change. His mother endorse decrease in weight x 6 months   HENT: Negative.   Eyes: Negative.   Respiratory: Negative for apnea, cough, choking, chest tightness, shortness of breath, wheezing and stridor.   Cardiovascular: Negative.   Gastrointestinal: Positive for blood in stool and rectal pain. Negative for nausea, vomiting, abdominal pain, diarrhea, constipation, abdominal distention and anal bleeding.  Endocrine: Negative.   Genitourinary: Negative for dysuria.  Musculoskeletal: Negative.   Skin: Negative.   Allergic/Immunologic: Negative.   Hematological: Negative.          Objective:   Physical Exam BP 129/86 mmHg  Pulse 80  Temp(Src) 98.5 F (36.9 C) (Oral)  Resp 16  Ht 6' (1.829 m)  Wt 148 lb (67.132 kg)  BMI 20.07 kg/m2  SpO2 99%  General Appearance:    Alert, cooperative, no distress, appears stated age, thin white male.   Head:    Normocephalic, without obvious abnormality, atraumatic  Eyes:    PERRL, conjunctiva/corneas clear, EOM's intact both eyes       Ears:    Normal TM's and external ear canals, both ears  Nose:   Nares normal, septum midline, mucosa normal, no drainage   or sinus tenderness  Throat:   Lips, mucosa, and tongue normal; poor dentition with carries and missing teeth  Neck:   Supple, symmetrical, trachea midline, no adenopathy;       thyroid:  No enlargement/tenderness/nodules.  Back:     Symmetric, no curvature, ROM normal, no CVA tenderness  Lungs:     Clear to auscultation bilaterally, respirations unlabored  Chest wall:    2cm x 2 cm soft circular mass under R nipple.   Heart:    Regular rate and rhythm, S1 and S2 normal, no murmur, rub   or gallop  Abdomen:     Soft, non-tender, bowel sounds active all four quadrants,    no masses, no organomegaly  Genitalia:  Deferred  Rectal:    External skin tags, mild increased tone, no bleeding, normal prostate w/o enlargement or tenderness.   Extremities:   Extremities normal, atraumatic, no cyanosis or edema  Pulses:   2+ and symmetric all extremities  Skin:   Skin color, texture, turgor normal, no rashes or lesions  Lymph nodes:   Cervical, supraclavicular, and axillary nodes normal  Neurologic:   CNII-XII intact.         Assessment & Plan:

## 2015-05-11 NOTE — Progress Notes (Signed)
Establish Care ED F/U Knot on Rt breast area x 1 month No pain, no discoloration, smaller since ED visit  Hx Tobacco- 1ppday

## 2015-05-11 NOTE — Patient Instructions (Addendum)
Mr. Matera,  Thank you for coming in today. It was a pleasure meeting you. I look forward to being your primary doctor.  1. R breast mass: Unilateral mass is concerning for cancer or liver disease- will get test to rule out both.  Please go for CXR Getting labs CBC, CMP Will order ultrasound of R breast depending on chest x-ray results  2. Smoking: It is very important to quit Smoking cessation support: smoking cessation hotline: 1-800-QUIT-NOW.  Smoking cessation classes are available through [facility]. Call [phone] or visit our website at [email or web address]   3. Screening: it is important to have recommended screening: you are due for colonoscopy, I will refer to GI once you have orange card or [place] discount.   4. Hemorrhoid: stool softener. I recommend milk of magnesia assuming you have normal kidney function or dulcolax.   Please apply for [facility] discount and orange card, you can also inquire if any of your medications are on the PASS (medications assistance) list.   F/u in 6 weeks for breast mass   Dr. Adrian Blackwater

## 2015-05-12 LAB — CBC
HCT: 43.1 % (ref 39.0–52.0)
HEMOGLOBIN: 14.6 g/dL (ref 13.0–17.0)
MCH: 33.9 pg (ref 26.0–34.0)
MCHC: 33.9 g/dL (ref 30.0–36.0)
MCV: 100 fL (ref 78.0–100.0)
MPV: 10.1 fL (ref 8.6–12.4)
PLATELETS: 275 10*3/uL (ref 150–400)
RBC: 4.31 MIL/uL (ref 4.22–5.81)
RDW: 13.7 % (ref 11.5–15.5)
WBC: 7.1 10*3/uL (ref 4.0–10.5)

## 2015-05-12 LAB — HIV ANTIBODY (ROUTINE TESTING W REFLEX): HIV 1&2 Ab, 4th Generation: NONREACTIVE

## 2015-05-13 ENCOUNTER — Encounter: Payer: Self-pay | Admitting: Family Medicine

## 2015-05-13 NOTE — Assessment & Plan Note (Signed)
Smoking: It is very important to quit Smoking cessation support: smoking cessation hotline: 1-800-QUIT-NOW.  Smoking cessation classes are available through [facility]. Call [phone] or visit our website at [email or web address]

## 2015-05-13 NOTE — Assessment & Plan Note (Signed)
Screening HIV ordered  

## 2015-05-13 NOTE — Assessment & Plan Note (Signed)
  1. R breast mass: Unilateral mass is concerning for cancer or liver disease- will get test to rule out both.  Please go for CXR Getting labs CBC, CMP Will order ultrasound of R breast depending on chest x-ray results

## 2015-05-13 NOTE — Assessment & Plan Note (Signed)
A: chronic hemorrhoid stable without thrombus  P:stool softener. I recommend milk of magnesia assuming you have normal kidney function or dulcolax.

## 2015-05-14 ENCOUNTER — Ambulatory Visit (HOSPITAL_COMMUNITY)
Admission: RE | Admit: 2015-05-14 | Discharge: 2015-05-14 | Disposition: A | Discharge status: Home / Self Care | Payer: Self-pay | Source: Ambulatory Visit | Attending: Family Medicine | Admitting: Family Medicine

## 2015-05-14 DIAGNOSIS — N6310 Unspecified lump in the right breast, unspecified quadrant: Secondary | ICD-10-CM

## 2015-05-14 DIAGNOSIS — N63 Unspecified lump in breast: Secondary | ICD-10-CM | POA: Insufficient documentation

## 2015-05-16 ENCOUNTER — Other Ambulatory Visit: Payer: Self-pay | Admitting: Family Medicine

## 2015-05-16 ENCOUNTER — Telehealth: Payer: Self-pay | Admitting: Family Medicine

## 2015-05-16 DIAGNOSIS — N6310 Unspecified lump in the right breast, unspecified quadrant: Secondary | ICD-10-CM

## 2015-05-16 NOTE — Assessment & Plan Note (Signed)
A: normal CXR P: f/u diagnostic ultrasound of R breast with axilla ordered

## 2015-05-16 NOTE — Telephone Encounter (Signed)
Patient called to request his chest x-ray results, and blood work results. Please f/u with pt.

## 2015-05-22 ENCOUNTER — Telehealth: Payer: Self-pay | Admitting: Family Medicine

## 2015-05-22 NOTE — Telephone Encounter (Signed)
Patient called requesting lab work and xray results. Please f/u with patient

## 2015-05-23 NOTE — Telephone Encounter (Signed)
-----   Message from Boykin Nearing, MD sent at 05/16/2015  9:10 AM EDT ----- Normal CXR Patient will need f/u ultrasound to evaluate lump under R nipple This has been ordered and needs to be scheduled

## 2015-05-23 NOTE — Telephone Encounter (Signed)
Pt aware of results 

## 2015-05-23 NOTE — Telephone Encounter (Signed)
-----   Message from Boykin Nearing, MD sent at 05/13/2015  2:54 PM EDT ----- Normal liver function Screening HIV negative All labs normal

## 2015-05-28 ENCOUNTER — Telehealth: Payer: Self-pay | Admitting: *Deleted

## 2015-05-28 ENCOUNTER — Other Ambulatory Visit: Payer: Self-pay | Admitting: Family Medicine

## 2015-05-28 DIAGNOSIS — N6310 Unspecified lump in the right breast, unspecified quadrant: Secondary | ICD-10-CM

## 2015-05-28 NOTE — Telephone Encounter (Signed)
Pt aware of [place] appointment  Appointment on 05/30/2015 at 10:15

## 2015-05-30 ENCOUNTER — Other Ambulatory Visit: Payer: Self-pay

## 2015-06-06 ENCOUNTER — Telehealth: Payer: Self-pay | Admitting: *Deleted

## 2015-06-06 ENCOUNTER — Other Ambulatory Visit: Payer: Self-pay

## 2015-06-06 DIAGNOSIS — Z0000 Encounter for general adult medical examination without abnormal findings: Secondary | ICD-10-CM

## 2015-06-06 NOTE — Telephone Encounter (Signed)
-----   Message from Boykin Nearing, MD sent at 05/13/2015  2:54 PM EDT ----- Normal liver function Screening HIV negative All labs normal

## 2015-06-12 ENCOUNTER — Ambulatory Visit
Admission: RE | Admit: 2015-06-12 | Discharge: 2015-06-12 | Disposition: A | Discharge status: Home / Self Care | Payer: No Typology Code available for payment source | Source: Ambulatory Visit | Attending: Family Medicine | Admitting: Family Medicine

## 2015-06-12 DIAGNOSIS — Z0000 Encounter for general adult medical examination without abnormal findings: Secondary | ICD-10-CM | POA: Insufficient documentation

## 2015-06-12 DIAGNOSIS — N6310 Unspecified lump in the right breast, unspecified quadrant: Secondary | ICD-10-CM

## 2015-06-12 NOTE — Addendum Note (Signed)
Addended by: Boykin Nearing on: 06/12/2015 04:44 PM   Modules accepted: Orders

## 2015-06-12 NOTE — Telephone Encounter (Signed)
-----   Message from Boykin Nearing, MD sent at 06/12/2015  2:56 PM EDT ----- Breast ultrasound and mammogram reveal gynecomastia w/o evidence of breast cancer

## 2015-06-12 NOTE — Telephone Encounter (Signed)
Pt aware of results 

## 2015-06-12 NOTE — Assessment & Plan Note (Signed)
GI referral for screening colonoscopy.

## 2015-06-21 ENCOUNTER — Encounter: Payer: Self-pay | Admitting: Family Medicine

## 2015-06-21 ENCOUNTER — Ambulatory Visit: Payer: No Typology Code available for payment source | Attending: Family Medicine | Admitting: Family Medicine

## 2015-06-21 DIAGNOSIS — N62 Hypertrophy of breast: Secondary | ICD-10-CM

## 2015-06-21 DIAGNOSIS — F1010 Alcohol abuse, uncomplicated: Secondary | ICD-10-CM

## 2015-06-21 DIAGNOSIS — Z0000 Encounter for general adult medical examination without abnormal findings: Secondary | ICD-10-CM

## 2015-06-21 DIAGNOSIS — Z23 Encounter for immunization: Secondary | ICD-10-CM

## 2015-06-21 DIAGNOSIS — K649 Unspecified hemorrhoids: Secondary | ICD-10-CM

## 2015-06-21 DIAGNOSIS — Z720 Tobacco use: Secondary | ICD-10-CM

## 2015-06-21 MED ORDER — VARENICLINE TARTRATE 0.5 MG X 11 & 1 MG X 42 PO MISC: ORAL | Status: DC

## 2015-06-21 MED ORDER — VARENICLINE TARTRATE 1 MG PO TABS: 1.0000 mg | ORAL_TABLET | Freq: Two times a day (BID) | ORAL | Status: DC

## 2015-06-21 NOTE — Patient Instructions (Addendum)
Derek Bullock,  Thank you for coming in today  1. Breast lump: Gynecomastia confirmed on [place] and mammogram Checking testosterone level Checking liver  Checking for Hep C since people born between [birth year] and 1965 are at higher risk   2. GI referral has already been placed for screening colonscopy  3. Hemorrhoids: gen surg referral placed today   4. Smoking: Ready to quit chantix starting pack then continuation pack, set quit date for 8-35 days after starting chantix You actually do not need to do the patch.   Cessation will be important to maintain health and prevent cardiovascular disease, stroke,  Smoking cessation support: smoking cessation hotline: 1-800-QUIT-NOW.  Smoking cessation classes are available through [facility]. Call [phone] or visit our website at [email or web address]  F/u in 2 months for smoking cessation  Dr. Adrian Blackwater

## 2015-06-21 NOTE — Assessment & Plan Note (Addendum)
Hemorrhoids: gen surg referral placed today

## 2015-06-21 NOTE — Assessment & Plan Note (Signed)
Breast lump: Gynecomastia confirmed on [place] and mammogram Checking testosterone level Checking liver  Checking for Hep C since people born between [place] are at higher risk

## 2015-06-21 NOTE — Assessment & Plan Note (Signed)
Smoking: Ready to quit chantix starting pack then continuation pack, set quit date for 8-35 days after starting chantix You actually do not need to do the patch.   Cessation will be important to maintain health and prevent cardiovascular disease, stroke,  Smoking cessation support: smoking cessation hotline: 1-800-QUIT-NOW.  Smoking cessation classes are available through [facility]. Call [phone] or visit our website at [email or web address]

## 2015-06-21 NOTE — Progress Notes (Signed)
F/U lump on breast Requesting referral Hx hemorrhoids

## 2015-06-21 NOTE — Progress Notes (Signed)
   Subjective:    Patient ID: Derek Bullock, male    DOB: 05-Aug-1961, 54 y.o.   MRN: [id number]  HPI  1. Smoker: 19 yrs total smoker. Was able to quit cold [place] for 6-7 yrs. Now smoking 1/2 PPD down from 1 PPD. No cough, CP or SOB.  2. Gynecomastia: breast lump noticed in 03/2015. Had mammogram and [place] that revealed gynecomastia. Has normal CXR.  Has hx of ETOH abuse. No GI upset, bleeding or bruising. Had recent normal liver transaminases. Continue to drink ETOH. Only medication is ASA.  3. Hemorrhoids: now has orange card. Has intermittent pain, swelling and bleeding. Hx of banding procedure. No active symptoms. Would like referral to GI.    Soc Hx: current smoker 1/2 PPD down from 1 PPD Review of Systems  Constitutional: Negative for fever, chills, fatigue and unexpected weight change.  Eyes: Negative for visual disturbance.  Respiratory: Negative for cough and shortness of breath.   Cardiovascular: Negative for chest pain, palpitations and leg swelling.  Gastrointestinal: Negative for nausea, vomiting, abdominal pain, diarrhea, constipation and blood in stool.  Musculoskeletal: Negative for myalgias, back pain, arthralgias, gait problem and neck pain.  Skin: Negative for rash.  Psychiatric/Behavioral: Negative for suicidal ideas and dysphoric mood.      Objective:   Physical Exam BP 116/67 mmHg  Pulse 81  Temp(Src) 98.8 F (37.1 C) (Oral)  Resp 16  Ht 6\' 1"  (1.854 m)  Wt 150 lb (68.04 kg)  BMI 19.79 kg/m2  SpO2 98% General appearance: alert, cooperative and no distress  Breast: small lump under R nipple, painless  Lungs: clear to auscultation bilaterally Heart: regular rate and rhythm, S1, S2 normal, no murmur, click, rub or gallop Abdomen: soft, non-tender; bowel sounds normal; no masses,  no organomegaly      Assessment & Plan:

## 2015-06-22 LAB — TESTOSTERONE: Testosterone: 341 ng/dL (ref 300–890)

## 2015-06-22 LAB — FSH/LH
FSH: 4.4 m[IU]/mL (ref 1.4–18.1)
LH: 4.8 m[IU]/mL (ref 1.5–9.3)

## 2015-06-22 LAB — HEPATITIS C ANTIBODY: HCV AB: NEGATIVE

## 2015-06-26 ENCOUNTER — Telehealth: Payer: Self-pay | Admitting: *Deleted

## 2015-06-26 NOTE — Telephone Encounter (Signed)
Pt aware of lab results 

## 2015-06-26 NOTE — Telephone Encounter (Signed)
-----   Message from Boykin Nearing, MD sent at 06/22/2015  3:36 PM EDT ----- Hep C negative Normal testosterone level

## 2015-06-28 ENCOUNTER — Ambulatory Visit (HOSPITAL_COMMUNITY)
Admission: RE | Admit: 2015-06-28 | Discharge: 2015-06-28 | Disposition: A | Discharge status: Home / Self Care | Payer: Self-pay | Source: Ambulatory Visit | Attending: Family Medicine | Admitting: Family Medicine

## 2015-06-28 DIAGNOSIS — F1010 Alcohol abuse, uncomplicated: Secondary | ICD-10-CM | POA: Insufficient documentation

## 2015-07-04 ENCOUNTER — Telehealth: Payer: Self-pay | Admitting: *Deleted

## 2015-07-04 NOTE — Telephone Encounter (Signed)
-----   Message from Boykin Nearing, MD sent at 06/28/2015  8:54 AM EDT ----- There are changes noted in liver of [place] either fatty liver or early hepatitis.  Advised avoidance of ETOH to prevent alcoholic hepatitis which can progress to cirrhosis and hepatocellular carcinoma

## 2015-07-04 NOTE — Telephone Encounter (Signed)
Pt aware of results 

## 2015-07-11 ENCOUNTER — Ambulatory Visit: Payer: Self-pay

## 2015-08-06 ENCOUNTER — Ambulatory Visit: Payer: Self-pay

## 2015-08-06 ENCOUNTER — Telehealth: Payer: Self-pay | Admitting: Family Medicine

## 2015-08-06 NOTE — Telephone Encounter (Signed)
Patient called to check on the referral for a colonoscopy. Please f/u

## 2015-08-09 NOTE — Telephone Encounter (Signed)
Pt will need the CAFA the orange card don't cover . I spoke to patient and he is aware of the walk in schedule to apply for the cafa

## 2015-08-09 NOTE — Telephone Encounter (Signed)
Pt is interested in being referred to a gastro specialist. He has an active orange card but not an active [facility] discount letter. It looks like he has been referred in the past. Please follow up with pt. Thank you.

## 2015-12-19 ENCOUNTER — Ambulatory Visit: Payer: No Typology Code available for payment source

## 2016-02-18 ENCOUNTER — Encounter: Payer: Self-pay | Admitting: Internal Medicine

## 2016-02-18 ENCOUNTER — Ambulatory Visit: Payer: No Typology Code available for payment source | Attending: Family Medicine | Admitting: Family Medicine

## 2016-02-18 ENCOUNTER — Encounter: Payer: Self-pay | Admitting: Family Medicine

## 2016-02-18 VITALS — BP 128/78 | HR 72 | Temp 98.1°F | Resp 16 | Ht 73.0 in | Wt 143.0 lb

## 2016-02-18 DIAGNOSIS — K649 Unspecified hemorrhoids: Secondary | ICD-10-CM | POA: Insufficient documentation

## 2016-02-18 DIAGNOSIS — F17210 Nicotine dependence, cigarettes, uncomplicated: Secondary | ICD-10-CM | POA: Insufficient documentation

## 2016-02-18 DIAGNOSIS — Z0000 Encounter for general adult medical examination without abnormal findings: Secondary | ICD-10-CM

## 2016-02-18 LAB — POCT GLYCOSYLATED HEMOGLOBIN (HGB A1C): HEMOGLOBIN A1C: 4.9

## 2016-02-18 MED ORDER — DOCUSATE SODIUM 100 MG PO CAPS: 100.0000 mg | ORAL_CAPSULE | Freq: Two times a day (BID) | ORAL | Status: DC | PRN

## 2016-02-18 MED ORDER — HYDROCORTISONE ACE-PRAMOXINE 2.5-1 % RE CREA: 1.0000 "application " | TOPICAL_CREAM | Freq: Three times a day (TID) | RECTAL | Status: DC

## 2016-02-18 MED ORDER — HYDROCORTISONE 2.5 % RE CREA: 1.0000 "application " | TOPICAL_CREAM | Freq: Two times a day (BID) | RECTAL | Status: DC

## 2016-02-18 MED FILL — HYDROCORT-PRAMOXINE 2.5-1%: 2.5-1 | 7 days supply | Qty: 30 | Fill #0

## 2016-02-18 NOTE — Patient Instructions (Addendum)
Derek Bullock was seen today for hemorrhoids.  Diagnoses and all orders for this visit:  Healthcare maintenance -     HgB A1c -     Ambulatory referral to Gastroenterology  Hemorrhoids, unspecified hemorrhoid type -     Ambulatory referral to Gastroenterology -     hydrocortisone (ANUSOL-HC) 2.5 % rectal cream; Place 1 application rectally 2 (two) times daily. -     docusate sodium (COLACE) 100 MG capsule; Take 1 capsule (100 mg total) by mouth 2 (two) times daily as needed for mild constipation.  continue preparation H as needed   GI referral placed for colonoscopy and office based treatment of hemorrhoid, this procedure is called rubber band ligation.   F/u in 6 weeks for hemorrhoids   Dr. Adrian Blackwater   Hemorrhoids Hemorrhoids are swollen veins around the rectum or anus. There are two types of hemorrhoids:   Internal hemorrhoids. These occur in the veins just inside the rectum. They may poke through to the outside and become irritated and painful.  External hemorrhoids. These occur in the veins outside the anus and can be felt as a painful swelling or hard lump near the anus. CAUSES  Pregnancy.   Obesity.   Constipation or diarrhea.   Straining to have a bowel movement.   Sitting for long periods on the toilet.  Heavy lifting or other activity that caused you to strain.  Anal intercourse. SYMPTOMS   Pain.   Anal itching or irritation.   Rectal bleeding.   Fecal leakage.   Anal swelling.   One or more lumps around the anus.  DIAGNOSIS  Your caregiver may be able to diagnose hemorrhoids by visual examination. Other examinations or tests that may be performed include:   Examination of the rectal area with a gloved hand (digital rectal exam).   Examination of anal canal using a small tube (scope).   A blood test if you have lost a significant amount of blood.  A test to look inside the colon (sigmoidoscopy or colonoscopy). TREATMENT Most hemorrhoids  can be treated at home. However, if symptoms do not seem to be getting better or if you have a lot of rectal bleeding, your caregiver may perform a procedure to help make the hemorrhoids get smaller or remove them completely. Possible treatments include:   Placing a rubber band at the base of the hemorrhoid to cut off the circulation (rubber band ligation).   Injecting a chemical to shrink the hemorrhoid (sclerotherapy).   Using a tool to burn the hemorrhoid (infrared light therapy).   Surgically removing the hemorrhoid (hemorrhoidectomy).   Stapling the hemorrhoid to block blood flow to the tissue (hemorrhoid stapling).  HOME CARE INSTRUCTIONS   Eat foods with fiber, such as whole grains, beans, nuts, fruits, and vegetables. Ask your doctor about taking products with added fiber in them (fibersupplements).  Increase fluid intake. Drink enough water and fluids to keep your urine clear or pale yellow.   Exercise regularly.   Go to the bathroom when you have the urge to have a bowel movement. Do not wait.   Avoid straining to have bowel movements.   Keep the anal area dry and clean. Use wet toilet paper or moist towelettes after a bowel movement.   Medicated creams and suppositories may be used or applied as directed.   Only take over-the-counter or prescription medicines as directed by your caregiver.   Take warm sitz baths for 15-20 minutes, 3-4 times a day to ease pain and discomfort.  Place ice packs on the hemorrhoids if they are tender and swollen. Using ice packs between sitz baths may be helpful.   Put ice in a plastic bag.   Place a towel between your skin and the bag.   Leave the ice on for 15-20 minutes, 3-4 times a day.   Do not use a donut-shaped pillow or sit on the toilet for long periods. This increases blood pooling and pain.  SEEK MEDICAL CARE IF:  You have increasing pain and swelling that is not controlled by treatment or  medicine.  You have uncontrolled bleeding.  You have difficulty or you are unable to have a bowel movement.  You have pain or inflammation outside the area of the hemorrhoids. MAKE SURE YOU:  Understand these instructions.  Will watch your condition.  Will get help right away if you are not doing well or get worse.   This information is not intended to replace advice given to you by your health care provider. Make sure you discuss any questions you have with your health care provider.   Document Released: 12/05/2000 Document Revised: 11/24/2012 Document Reviewed: 10/12/2012 Elsevier Interactive Patient Education [employer].

## 2016-02-18 NOTE — Addendum Note (Signed)
Addended by: Boykin Nearing on: 02/18/2016 12:49 PM   Modules accepted: Orders

## 2016-02-18 NOTE — Assessment & Plan Note (Addendum)
A: chronic hemorrhoids P: Add anusol HC and colace to med regimen GI referral for possible rubber band ligation

## 2016-02-18 NOTE — Progress Notes (Signed)
C/C hemorrhoid. No pain today  Tobacco user 1/2ppday  No suicidal thoughts in the past two weeks

## 2016-02-18 NOTE — Progress Notes (Signed)
Patient ID: Derek Bullock, male   DOB: Dec 18, 1961, 55 y.o.   MRN: [id number]   Subjective:    Patient ID: Derek Bullock, male    DOB: 04-18-1961, 55 y.o.   MRN: [id number]  HPI  1 . Hemorrhoids: he has daily pain. At times pain is severe. He uses preparation H at home which helps when pain is mild to moderate.  He has not yet seen GI or General surgery for his hemorrhoids.    Social History  Substance Use Topics  . Smoking status: Current Every Day Smoker -- 1.00 packs/day for 12 years    Types: Cigarettes  . Smokeless tobacco: Never Used  . Alcohol Use: Yes     Comment: 80 0z of beer 3 times a week    Review of Systems  Constitutional: Negative for fever, chills, fatigue and unexpected weight change.  Eyes: Negative for visual disturbance.  Respiratory: Negative for cough and shortness of breath.   Cardiovascular: Negative for chest pain, palpitations and leg swelling.  Gastrointestinal: Positive for blood in stool, anal bleeding and rectal pain. Negative for nausea, vomiting, abdominal pain, diarrhea and constipation.  Musculoskeletal: Negative for myalgias, back pain, arthralgias, gait problem and neck pain.  Skin: Negative for rash.  Psychiatric/Behavioral: Negative for suicidal ideas and dysphoric mood.      Objective:   Physical Exam  Constitutional: He appears well-developed and well-nourished. No distress.  HENT:  Head: Normocephalic and atraumatic.  Neck: Normal range of motion. Neck supple.  Pulmonary/Chest: Effort normal.  Genitourinary: Rectal exam shows external hemorrhoid and tenderness.     Musculoskeletal: He exhibits no edema.  Neurological: He is alert.  Skin: Skin is warm and dry. No rash noted. No erythema.  Psychiatric: He has a normal mood and affect.   Lab Results  Component Value Date   HGBA1C 4.9 02/18/2016         Assessment & Plan:  Byren was seen today for hemorrhoids.  Diagnoses and all orders for this visit:  Healthcare  maintenance -     HgB A1c -     Ambulatory referral to Gastroenterology  Hemorrhoids, unspecified hemorrhoid type -     Ambulatory referral to Gastroenterology -     hydrocortisone (ANUSOL-HC) 2.5 % rectal cream; Place 1 application rectally 2 (two) times daily. -     docusate sodium (COLACE) 100 MG capsule; Take 1 capsule (100 mg total) by mouth 2 (two) times daily as needed for mild constipation.

## 2016-04-02 ENCOUNTER — Ambulatory Visit (AMBULATORY_SURGERY_CENTER): Payer: Self-pay | Admitting: *Deleted

## 2016-04-02 VITALS — Ht 73.0 in | Wt 139.0 lb

## 2016-04-02 DIAGNOSIS — Z1211 Encounter for screening for malignant neoplasm of colon: Secondary | ICD-10-CM

## 2016-04-02 MED ORDER — POLYETHYLENE GLYCOL 3350 17 GM/SCOOP PO POWD: ORAL | Status: DC

## 2016-04-02 MED ORDER — BISACODYL 5 MG PO TBEC: DELAYED_RELEASE_TABLET | ORAL | Status: DC

## 2016-04-02 MED FILL — POLYETHYLENE GLYCOL 3350: 1 days supply | Qty: 255 | Fill #0

## 2016-04-02 NOTE — Progress Notes (Signed)
No egg or soy allergy. No anesthesia problems.  No home O2.  No diet meds.  No emmi given.  

## 2016-04-10 ENCOUNTER — Encounter: Payer: Self-pay | Admitting: Internal Medicine

## 2016-04-10 ENCOUNTER — Ambulatory Visit (AMBULATORY_SURGERY_CENTER): Payer: Self-pay | Admitting: Internal Medicine

## 2016-04-10 ENCOUNTER — Telehealth: Payer: Self-pay

## 2016-04-10 VITALS — BP 125/75 | HR 62 | Temp 97.3°F | Resp 13 | Ht 73.0 in | Wt 139.0 lb

## 2016-04-10 DIAGNOSIS — D120 Benign neoplasm of cecum: Secondary | ICD-10-CM

## 2016-04-10 DIAGNOSIS — D122 Benign neoplasm of ascending colon: Secondary | ICD-10-CM

## 2016-04-10 DIAGNOSIS — D127 Benign neoplasm of rectosigmoid junction: Secondary | ICD-10-CM

## 2016-04-10 DIAGNOSIS — D124 Benign neoplasm of descending colon: Secondary | ICD-10-CM

## 2016-04-10 DIAGNOSIS — D123 Benign neoplasm of transverse colon: Secondary | ICD-10-CM

## 2016-04-10 DIAGNOSIS — Z1211 Encounter for screening for malignant neoplasm of colon: Secondary | ICD-10-CM

## 2016-04-10 DIAGNOSIS — D125 Benign neoplasm of sigmoid colon: Secondary | ICD-10-CM

## 2016-04-10 DIAGNOSIS — D128 Benign neoplasm of rectum: Secondary | ICD-10-CM

## 2016-04-10 MED ORDER — SODIUM CHLORIDE 0.9 % IV SOLN: 500.0000 mL | INTRAVENOUS | Status: DC

## 2016-04-10 NOTE — Telephone Encounter (Signed)
Pt has orange card and would be self pay for CCS. First visit will be 166.60 up front. Pt would like to be referred to [facility]. Please advise which doctor you would like him referred to.

## 2016-04-10 NOTE — Op Note (Signed)
[facility] Patient Name: Derek Bullock Procedure Date: 04/10/2016 10:21 AM MRN: [id number] Endoscopist: Jerene Bears , MD Age: 55 Date of Birth: Nov 18, 1961 Gender: Male Procedure:                Colonoscopy Indications:              Screening for colorectal malignant neoplasm, This                            is the patient's first colonoscopy Medicines:                Monitored Anesthesia Care Procedure:                Pre-Anesthesia Assessment:                           - Prior to the procedure, a History and Physical                            was performed, and patient medications and                            allergies were reviewed. The patient's tolerance of                            previous anesthesia was also reviewed. The risks                            and benefits of the procedure and the sedation                            options and risks were discussed with the patient.                            All questions were answered, and informed consent                            was obtained. Prior Anticoagulants: The patient has                            taken no previous anticoagulant or antiplatelet                            agents. ASA Grade Assessment: III - A patient with                            severe systemic disease. After reviewing the risks                            and benefits, the patient was deemed in                            satisfactory condition to undergo the procedure.  After obtaining informed consent, the colonoscope                            was passed under direct vision. Throughout the                            procedure, the patient's blood pressure, pulse, and                            oxygen saturations were monitored continuously. The                            Model PCF-H190L [id number]) scope was introduced                            through the anus and advanced to the the cecum,                identified by appendiceal orifice and ileocecal                            valve. The colonoscopy was performed without                            difficulty. The patient tolerated the procedure                            well. The quality of the bowel preparation was                            good. The ileocecal valve, appendiceal orifice, and                            rectum were photographed. Scope In: 10:35:37 AM Scope Out: 11:11:16 AM Scope Withdrawal Time: 0 hours 30 minutes 46 seconds  Total Procedure Duration: 0 hours 35 minutes 39 seconds  Findings:                 The perianal exam findings include thrombosed                            external hemorrhoids, non-thrombosed internal                            hemorrhoids and internal hemorrhoids that prolapse                            with straining, but require manual replacement into                            the anal canal (Grade III).                           A 3 mm polyp was found in the cecum. The polyp was  sessile. The polyp was removed with a cold biopsy                            forceps. Resection and retrieval were complete.                           A 10 mm polyp was found in the ascending colon. The                            polyp was semi-pedunculated. The polyp was removed                            with a hot snare. Resection and retrieval were                            complete.                           A 5 mm polyp was found in the ascending colon. The                            polyp was sessile. The polyp was removed with a                            cold snare. Resection and retrieval were complete.                           A 5 mm polyp was found in the transverse colon. The                            polyp was sessile. The polyp was removed with a                            cold snare. Resection and retrieval were complete.                           A 10 mm  polyp was found in the transverse colon.                            The polyp was flat. The polyp was removed with a                            hot snare. Resection and retrieval were complete.                           Two sessile polyps were found in the sigmoid colon                            and descending colon. The polyps were 4 to 5 mm in                            size. These  polyps were removed with a cold snare.                            Resection and retrieval were complete.                           Three sessile polyps were found in the rectum and                            recto-sigmoid colon. The polyps were 4 to 5 mm in                            size. These polyps were removed with a cold snare.                            Resection and retrieval were complete.                           Scattered small-mouthed diverticula were found from                            ascending colon to sigmoid colon.                           External and internal hemorrhoids were found during                            retroflexion, during perianal exam and during                            digital exam. The hemorrhoids were large. Complications:            No immediate complications. Estimated Blood Loss:     Estimated blood loss: none. Impression:               - Thrombosed external hemorrhoids, non-thrombosed                            internal hemorrhoids and internal hemorrhoids that                            prolapse with straining, but require manual                            replacement into the anal canal (Grade III) found                            on perianal exam.                           - One 3 mm polyp in the cecum, removed with a cold                            biopsy forceps. Resected and retrieved.                           -  One 10 mm polyp in the ascending colon, removed                            with a hot snare. Resected and retrieved.                           -  One 5 mm polyp in the ascending colon, removed                            with a cold snare. Resected and retrieved.                           - One 5 mm polyp in the transverse colon, removed                            with a cold snare. Resected and retrieved.                           - One 10 mm polyp in the transverse colon, removed                            with a hot snare. Resected and retrieved.                           - Two 4 to 5 mm polyps in the sigmoid colon and in                            the descending colon, removed with a cold snare.                            Resected and retrieved.                           - Three 4 to 5 mm polyps in the rectum and at the                            recto-sigmoid colon, removed with a cold snare.                            Resected and retrieved.                           - Diverticulosis from ascending colon to sigmoid                            colon.                           - External and internal hemorrhoids. Recommendation:           - Patient has a contact number available for                            emergencies.  The signs and symptoms of potential                            delayed complications were discussed with the                            patient. Return to normal activities tomorrow.                            Written discharge instructions were provided to the                            patient.                           - Resume previous diet.                           - Continue present medications.                           - No aspirin, ibuprofen, naproxen, or other                            non-steroidal anti-inflammatory drugs for 2 weeks                            after polyp removal.                           - Await pathology results.                           - Repeat colonoscopy is recommended for                            surveillance. The colonoscopy date will be                             determined after pathology results from today's                            exam become available for review.                           - Refer to Dr. Johney Maine at [facility] at appointment to be                            scheduled to address large symptomatic hemorrhoids. Jerene Bears, MD 04/10/2016 11:23:09 AM This report has been signed electronically.

## 2016-04-10 NOTE — Patient Instructions (Signed)
Colon polyps removed today. Diverticulosis and Hemorrhoids seen. Handouts given.  No aspirin, ibuprofen, naproxen, or other non-steroidal, anti-inflammatory drugs for 2 weeks. (may resume on May 5,2017) Result letter will be sent in your mail in 2-3 weeks. Refer to Dr.Gross at [facility].    YOU HAD AN ENDOSCOPIC PROCEDURE TODAY AT [facility]:   Refer to the procedure report that was given to you for any specific questions about what was found during the examination.  If the procedure report does not answer your questions, please call your gastroenterologist to clarify.  If you requested that your care partner not be given the details of your procedure findings, then the procedure report has been included in a sealed envelope for you to review at your convenience later.  YOU SHOULD EXPECT: Some feelings of bloating in the abdomen. Passage of more gas than usual.  Walking can help get rid of the air that was put into your GI tract during the procedure and reduce the bloating. If you had a lower endoscopy (such as a colonoscopy or flexible sigmoidoscopy) you may notice spotting of blood in your stool or on the toilet paper. If you underwent a bowel prep for your procedure, you may not have a normal bowel movement for a few days.  Please Note:  You might notice some irritation and congestion in your nose or some drainage.  This is from the oxygen used during your procedure.  There is no need for concern and it should clear up in a day or so.  SYMPTOMS TO REPORT IMMEDIATELY:   Following lower endoscopy (colonoscopy or flexible sigmoidoscopy):  Excessive amounts of blood in the stool  Significant tenderness or worsening of abdominal pains  Swelling of the abdomen that is new, acute  Fever of 100F or higher   For urgent or emergent issues, a gastroenterologist can be reached at any hour by calling [phone].   DIET: Your first meal following the procedure should  be a small meal and then it is ok to progress to your normal diet. Heavy or fried foods are harder to digest and may make you feel nauseous or bloated.  Likewise, meals heavy in dairy and vegetables can increase bloating.  Drink plenty of fluids but you should avoid alcoholic beverages for 24 hours.  ACTIVITY:  You should plan to take it easy for the rest of today and you should NOT DRIVE or use heavy machinery until tomorrow (because of the sedation medicines used during the test).    FOLLOW UP: Our staff will call the number listed on your records the next business day following your procedure to check on you and address any questions or concerns that you may have regarding the information given to you following your procedure. If we do not reach you, we will leave a message.  However, if you are feeling well and you are not experiencing any problems, there is no need to return our call.  We will assume that you have returned to your regular daily activities without incident.  If any biopsies were taken you will be contacted by phone or by letter within the next 1-3 weeks.  Please call us at [phone] if you have not heard about the biopsies in 3 weeks.    SIGNATURES/CONFIDENTIALITY: You and/or your care partner have signed paperwork which will be entered into your electronic medical record.  These signatures attest to the fact that that the information above on your After  Visit Summary has been reviewed and is understood.  Full responsibility of the confidentiality of this discharge information lies with you and/or your care-partner. 

## 2016-04-10 NOTE — Progress Notes (Signed)
Called to room to assist during endoscopic procedure.  Patient ID and intended procedure confirmed with present staff. Received instructions for my participation in the procedure from the performing physician.  

## 2016-04-10 NOTE — Progress Notes (Signed)
A/ox3 pleased with MAC, report to Robin RN 

## 2016-04-10 NOTE — Telephone Encounter (Signed)
pls refer him to [facility] colorectal surg.

## 2016-04-11 ENCOUNTER — Telehealth: Payer: Self-pay

## 2016-04-11 NOTE — Telephone Encounter (Signed)
  Follow up Call-  Call back number 04/10/2016  Post procedure Call Back phone  # [phone] cell  Permission to leave phone message Yes     Patient questions:  Do you have a fever, pain , or abdominal swelling? No. Pain Score  0 *  Have you tolerated food without any problems? Yes.    Have you been able to return to your normal activities? Yes.    Do you have any questions about your discharge instructions: Diet   No. Medications  No. Follow up visit  No.  Do you have questions or concerns about your Care? No.  Actions: * If pain score is 4 or above: No action needed, pain <4.

## 2016-04-11 NOTE — Telephone Encounter (Signed)
Left message at [facility] colorectal surgery to schedule pt for hemorrhoid surgery. Called [phone] and left message for them to call back.

## 2016-04-14 NOTE — Telephone Encounter (Signed)
Pt scheduled to see Dr. Deberah Castle July 20@11am . [facility] will mail out information regarding appt. Will fax records once path report back to [phone].

## 2016-04-16 NOTE — Telephone Encounter (Signed)
Pt aware, records faxed.

## 2016-04-17 ENCOUNTER — Encounter: Payer: Self-pay | Admitting: Internal Medicine

## 2016-06-30 ENCOUNTER — Telehealth: Payer: Self-pay | Admitting: Internal Medicine

## 2016-06-30 NOTE — Telephone Encounter (Signed)
Pt given the phone number to call at [facility] for Dr. Deberah Castle.

## 2016-07-09 ENCOUNTER — Ambulatory Visit: Payer: No Typology Code available for payment source

## 2016-07-17 ENCOUNTER — Ambulatory Visit: Payer: No Typology Code available for payment source

## 2016-07-23 ENCOUNTER — Ambulatory Visit: Payer: No Typology Code available for payment source

## 2017-01-09 ENCOUNTER — Encounter: Payer: Self-pay | Admitting: *Deleted

## 2017-01-26 ENCOUNTER — Encounter: Payer: Self-pay | Admitting: Internal Medicine

## 2020-02-19 ENCOUNTER — Ambulatory Visit: Payer: Self-pay | Attending: Internal Medicine

## 2020-02-19 DIAGNOSIS — Z23 Encounter for immunization: Secondary | ICD-10-CM | POA: Insufficient documentation

## 2020-02-19 NOTE — Progress Notes (Signed)
   Covid-19 Vaccination Clinic  Name:  Derek Bullock    MRN: [id number] DOB: [birth year]  02/19/2020  [name] was observed post Covid-19 immunization for 15 minutes without incidence. He was provided with Vaccine Information Sheet and instruction to access the V-Safe system.   [name] was instructed to call 911 with any severe reactions post vaccine: Marland Kitchen Difficulty breathing  . Swelling of your face and throat  . A fast heartbeat  . A bad rash all over your body  . Dizziness and weakness    Immunizations Administered    Name Date Dose VIS Date Route   Pfizer COVID-19 Vaccine 02/19/2020 12:25 PM 0.3 mL 12/02/2019 Intramuscular   Manufacturer: [place]   Lot: [id number]   [place]: [id number]

## 2020-03-21 ENCOUNTER — Ambulatory Visit: Payer: Self-pay | Attending: Internal Medicine

## 2020-03-21 DIAGNOSIS — Z23 Encounter for immunization: Secondary | ICD-10-CM

## 2020-03-21 NOTE — Progress Notes (Signed)
   Covid-19 Vaccination Clinic  Name:  Derek Bullock    MRN: [id number] DOB: 02-12-1961  03/21/2020  [name] was observed post Covid-19 immunization for 15 minutes without incident. He was provided with Vaccine Information Sheet and instruction to access the V-Safe system.   [name] was instructed to call 911 with any severe reactions post vaccine: Marland Kitchen Difficulty breathing  . Swelling of face and throat  . A fast heartbeat  . A bad rash all over body  . Dizziness and weakness   Immunizations Administered    Name Date Dose VIS Date Route   Pfizer COVID-19 Vaccine 03/21/2020 11:46 AM 0.3 mL 12/02/2019 Intramuscular   Manufacturer: [place]   Lot: [id number]   [place] [place]: [id number]

## 2021-11-27 ENCOUNTER — Ambulatory Visit (HOSPITAL_COMMUNITY)
Admission: RE | Admit: 2021-11-27 | Discharge: 2021-11-27 | Disposition: A | Discharge status: Home / Self Care | Payer: No Typology Code available for payment source | Source: Ambulatory Visit | Attending: Family Medicine | Admitting: Family Medicine

## 2021-11-27 ENCOUNTER — Other Ambulatory Visit: Payer: Self-pay

## 2021-11-27 ENCOUNTER — Encounter (HOSPITAL_COMMUNITY): Payer: Self-pay

## 2021-11-27 ENCOUNTER — Ambulatory Visit (INDEPENDENT_AMBULATORY_CARE_PROVIDER_SITE_OTHER): Payer: No Typology Code available for payment source

## 2021-11-27 VITALS — BP 121/80 | HR 104 | Temp 98.3°F | Resp 20

## 2021-11-27 DIAGNOSIS — R053 Chronic cough: Secondary | ICD-10-CM

## 2021-11-27 DIAGNOSIS — R059 Cough, unspecified: Secondary | ICD-10-CM

## 2021-11-27 DIAGNOSIS — R062 Wheezing: Secondary | ICD-10-CM

## 2021-11-27 MED ORDER — AZITHROMYCIN 250 MG PO TABS: 250.0000 mg | ORAL_TABLET | Freq: Every day | ORAL | 0 refills | Status: DC

## 2021-11-27 MED ORDER — PREDNISONE 20 MG PO TABS: 40.0000 mg | ORAL_TABLET | Freq: Every day | ORAL | 0 refills | Status: DC

## 2021-11-27 NOTE — ED Provider Notes (Signed)
[facility]   [id number] 11/27/21 Arrival Time: 7829  ASSESSMENT & PLAN:  1. Persistent cough for 3 weeks or longer   2. Wheezing    I have personally viewed the imaging studies ordered this visit. No signs of PNA.  Given duration in recent smoker will tx with below:  Meds ordered this encounter  Medications   azithromycin (ZITHROMAX) 250 MG tablet    Sig: Take 1 tablet (250 mg total) by mouth daily. Take first 2 tablets together, then 1 every day until finished.    Dispense:  6 tablet    Refill:  0   predniSONE (DELTASONE) 20 MG tablet    Sig: Take 2 tablets (40 mg total) by mouth daily.    Dispense:  10 tablet    Refill:  0   No resp distress.   Follow-up Information     [facility].   Specialty: Urgent Care Why: If worsening or failing to improve as anticipated. Contact information: [place] [phone]                Reviewed expectations re: course of current medical issues. Questions answered. Outlined signs and symptoms indicating need for more acute intervention. Understanding verbalized. After Visit Summary given.   SUBJECTIVE: History from: patient. Derek Bullock is a 60 y.o. male who reports: persistent, occas productive cough; x at least 3 weeks. Reports smoking cessation 2 mo ago. Denies: fever and difficulty breathing. Normal PO intake without n/v/d.   OBJECTIVE:  Vitals:   11/27/21 1002  BP: 121/80  Pulse: (!) 104  Resp: 20  Temp: 98.3 F (36.8 C)  TempSrc: Oral  SpO2: 99%    General appearance: alert; no distress Eyes: PERRLA; EOMI; conjunctiva normal HENT: [place]; AT; with mild nasal congestion Neck: supple  Lungs: speaks full sentences without difficulty; unlabored; mild bilat wheezing Extremities: no edema Skin: warm and dry Neurologic: normal gait Psychological: alert and cooperative; normal mood and affect  Imaging: DG Chest 2  View  Result Date: 11/27/2021 CLINICAL DATA:  Persistent cough over the last 3 weeks EXAM: CHEST - 2 VIEW COMPARISON:  05/14/2015 FINDINGS: Heart size is normal. Mediastinal shadows are normal. There is mild central bronchial thickening but there is no infiltrate, collapse or effusion. Ordinary mild degenerative changes affect the spine. IMPRESSION: Possible bronchitis.  No consolidation or collapse. Electronically Signed   By: Nelson Chimes M.D.   On: 11/27/2021 10:19    Allergies  Allergen Reactions   Codeine Rash    Past Medical History:  Diagnosis Date   Arrhythmia 2007    GERD (gastroesophageal reflux disease)    Hemorrhoid 2000   s/p banding    Social History   Socioeconomic History   Marital status: Divorced    Spouse name: Not on file   Number of children: 0   Years of education: 11   Highest education level: Not on file  Occupational History   Occupation: Unemployed  Tobacco Use   Smoking status: Former    Packs/day: 1.00    Years: 12.00    Pack years: 12.00    Types: Cigarettes   Smokeless tobacco: Never  Vaping Use   Vaping Use: Never used  Substance and Sexual Activity   Alcohol use: Not Currently    Alcohol/week: 20.0 standard drinks    Types: 20 Cans of beer per week    Comment: 80 0z of beer 3 times a week  Drug use: Yes    Types: Marijuana    Comment: Smoked mariijuana last night   Sexual activity: Not on file  Other Topics Concern   Not on file  Social History Narrative   Mom and brother    Social Determinants of Health   Financial Resource Strain: Not on file  Food Insecurity: Not on file  Transportation Needs: Not on file  Physical Activity: Not on file  Stress: Not on file  Social Connections: Not on file  Intimate Partner Violence: Not on file   Family History  Problem Relation Age of Onset   Diabetes Mother    Heart disease Mother    Cancer Father        kidney    Heart disease Father    Glaucoma Father    Colon polyps Brother     Colon cancer Paternal Grandmother    Esophageal cancer Neg Hx    Rectal cancer Neg Hx    Stomach cancer Neg Hx    Past Surgical History:  Procedure Laterality Date   CARDIOVERSION  2007   [place] Left 1996     Vanessa Kick, MD 11/27/21 1102

## 2021-11-27 NOTE — ED Triage Notes (Signed)
Onset of symptoms 3 weeks ago.  Patient has a cough, chest congestion, feels week, intermittent diarrhea

## 2022-11-24 ENCOUNTER — Telehealth: Payer: Self-pay | Admitting: Internal Medicine

## 2022-11-24 ENCOUNTER — Ambulatory Visit: Payer: Medicaid Other | Admitting: Internal Medicine

## 2022-11-24 ENCOUNTER — Encounter: Payer: Self-pay | Admitting: Internal Medicine

## 2022-11-24 VITALS — BP 113/69 | HR 74 | Temp 98.4°F | Resp 12 | Ht 73.0 in | Wt 186.4 lb

## 2022-11-24 DIAGNOSIS — Z0000 Encounter for general adult medical examination without abnormal findings: Secondary | ICD-10-CM

## 2022-11-24 DIAGNOSIS — K7460 Unspecified cirrhosis of liver: Secondary | ICD-10-CM | POA: Insufficient documentation

## 2022-11-24 DIAGNOSIS — H6123 Impacted cerumen, bilateral: Secondary | ICD-10-CM | POA: Diagnosis not present

## 2022-11-24 DIAGNOSIS — M151 Heberden's nodes (with arthropathy): Secondary | ICD-10-CM

## 2022-11-24 DIAGNOSIS — F10288 Alcohol dependence with other alcohol-induced disorder: Secondary | ICD-10-CM | POA: Insufficient documentation

## 2022-11-24 DIAGNOSIS — Z86010 Personal history of colonic polyps: Secondary | ICD-10-CM | POA: Diagnosis not present

## 2022-11-24 DIAGNOSIS — Z720 Tobacco use: Secondary | ICD-10-CM

## 2022-11-24 DIAGNOSIS — K7031 Alcoholic cirrhosis of liver with ascites: Secondary | ICD-10-CM

## 2022-11-24 DIAGNOSIS — H9311 Tinnitus, right ear: Secondary | ICD-10-CM

## 2022-11-24 DIAGNOSIS — R2689 Other abnormalities of gait and mobility: Secondary | ICD-10-CM

## 2022-11-24 DIAGNOSIS — R002 Palpitations: Secondary | ICD-10-CM

## 2022-11-24 HISTORY — DX: Tinnitus, right ear: H93.11

## 2022-11-24 HISTORY — DX: Impacted cerumen, bilateral: H61.23

## 2022-11-24 HISTORY — DX: Unspecified cirrhosis of liver: K74.60

## 2022-11-24 HISTORY — DX: Other abnormalities of gait and mobility: R26.89

## 2022-11-24 HISTORY — DX: Alcohol dependence with other alcohol-induced disorder: F10.288

## 2022-11-24 LAB — CBC WITH DIFFERENTIAL/PLATELET
Basophils Absolute: 0.1 10*3/uL (ref 0.0–0.1)
Basophils Relative: 1.2 % (ref 0.0–3.0)
Eosinophils Absolute: 0.1 10*3/uL (ref 0.0–0.7)
Eosinophils Relative: 1.9 % (ref 0.0–5.0)
HCT: 40.5 % (ref 39.0–52.0)
Hemoglobin: 13.8 g/dL (ref 13.0–17.0)
Lymphocytes Relative: 35 % (ref 12.0–46.0)
Lymphs Abs: 1.7 10*3/uL (ref 0.7–4.0)
MCHC: 34.1 g/dL (ref 30.0–36.0)
MCV: 100.8 fl — ABNORMAL HIGH (ref 78.0–100.0)
Monocytes Absolute: 0.8 10*3/uL (ref 0.1–1.0)
Monocytes Relative: 15.7 % — ABNORMAL HIGH (ref 3.0–12.0)
Neutro Abs: 2.3 10*3/uL (ref 1.4–7.7)
Neutrophils Relative %: 46.2 % (ref 43.0–77.0)
Platelets: 265 10*3/uL (ref 150.0–400.0)
RBC: 4.02 Mil/uL — ABNORMAL LOW (ref 4.22–5.81)
RDW: 14 % (ref 11.5–15.5)
WBC: 5 10*3/uL (ref 4.0–10.5)

## 2022-11-24 LAB — COMPREHENSIVE METABOLIC PANEL
ALT: 44 U/L (ref 0–53)
AST: 51 U/L — ABNORMAL HIGH (ref 0–37)
Albumin: 4.5 g/dL (ref 3.5–5.2)
Alkaline Phosphatase: 81 U/L (ref 39–117)
BUN: 6 mg/dL (ref 6–23)
CO2: 28 mEq/L (ref 19–32)
Calcium: 9.6 mg/dL (ref 8.4–10.5)
Chloride: 98 mEq/L (ref 96–112)
Creatinine, Ser: 0.81 mg/dL (ref 0.40–1.50)
GFR: 94.89 mL/min (ref >60.00)
Glucose, Bld: 82 mg/dL (ref 70–99)
Potassium: 5.1 mEq/L (ref 3.5–5.1)
Sodium: 134 mEq/L — ABNORMAL LOW (ref 135–145)
Total Bilirubin: 0.4 mg/dL (ref 0.2–1.2)
Total Protein: 7 g/dL (ref 6.0–8.3)

## 2022-11-24 LAB — LIPID PANEL
Cholesterol: 219 mg/dL — ABNORMAL HIGH (ref 0–200)
HDL: 113.2 mg/dL (ref >39.00)
LDL Cholesterol: 96 mg/dL (ref 0–99)
NonHDL: 105.94
Total CHOL/HDL Ratio: 2
Triglycerides: 48 mg/dL (ref 0.0–149.0)
VLDL: 9.6 mg/dL (ref 0.0–40.0)

## 2022-11-24 MED ORDER — ACAMPROSATE CALCIUM 333 MG PO TBEC: 666.0000 mg | DELAYED_RELEASE_TABLET | Freq: Three times a day (TID) | ORAL | 11 refills | Status: DC

## 2022-11-24 MED ORDER — THIAMINE HCL 100 MG PO TABS: 100.0000 mg | ORAL_TABLET | Freq: Every day | ORAL | 3 refills | Status: AC

## 2022-11-24 MED ORDER — CYANOCOBALAMIN 1000 MCG/ML IJ SOLN: 1000.0000 ug | Freq: Once | INTRAMUSCULAR | 0 refills | Status: AC

## 2022-11-24 MED ORDER — ONE-A-DAY MENS PO TABS: 1.0000 | ORAL_TABLET | Freq: Every day | ORAL | 3 refills | Status: AC

## 2022-11-24 MED ORDER — DEBROX 6.5 % OT SOLN: 5.0000 [drp] | Freq: Two times a day (BID) | OTIC | 0 refills | Status: DC

## 2022-11-24 NOTE — Assessment & Plan Note (Signed)
He is very motivated to try to quit drinking I explained he is to not suddenly stop and to half the beverages that he drinks which seems to be around 100 ounces of beer daily so he will be aiming for 50 ounces of beer for the next few weeks and then come back and see me in a few weeks and see how he is doing.  I think he might run into some problems with cravings I am giving him acamprosate to help with that and encouraged him to take it is much as he wants and encouraging him to also stay hydrated and take multivitamins during this process explaining that it will not be easy to stop drinking

## 2022-11-24 NOTE — Assessment & Plan Note (Signed)
I explained that based on his pathology report from 2017 I think he should have a repeat colonoscopy soon and he would like to have it done in [place] so I am setting up a fresh referral is a lot closer than [facility]

## 2022-11-24 NOTE — Progress Notes (Signed)
[place]  Phone: [phone]  New patient visit  Visit Date: 11/24/2022 Patient: Derek Bullock   DOB: [birth year]   61 y.o. Male  MRN: [id number]  Today's healthcare provider: Loralee Pacas, MD  Assessment and Plan:   This is a 61 year old male with no relevant medical problems no history of problems with his ears normal vitals but complaining of 1 week of ringing in the ears.  The only medicine he takes is omeprazole 40 mg but has been taking it for a long time.  He has not taken any other meds before it started.  He drinks about 100 oz daily beer and about 1 pack smoke ever  Derek Bullock was seen today for establish care, ringing in right ear and discuss blood work.  Preventative health care -     CBC with Differential/Platelet -     Comprehensive metabolic panel -     Lipid panel  Colon cancer screening  Tinnitus of right ear -     Debrox; Place 5 drops into both ears 2 (two) times daily.  Dispense: 15 mL; Refill: 0  Alcohol dependence with other alcohol-induced disorder (HCC) Overview: He has a protuberant abdomen with fluid shift and asterixis with hand grab I informed him this is concerning for advanced liver disease  Assessment & Plan: He is very motivated to try to quit drinking I explained he is to not suddenly stop and to half the beverages that he drinks which seems to be around 100 ounces of beer daily so he will be aiming for 50 ounces of beer for the next few weeks and then come back and see me in a few weeks and see how he is doing.  I think he might run into some problems with cravings I am giving him acamprosate to help with that and encouraged him to take it is much as he wants and encouraging him to also stay hydrated and take multivitamins during this process explaining that it will not be easy to stop drinking  Orders: -     Acamprosate Calcium; Take 2 tablets (666 mg total) by mouth 3 (three) times daily with meals.  Dispense: 180  tablet; Refill: 11 -     One-A-Day Mens; Take 1 tablet by mouth daily.  Dispense: 90 tablet; Refill: 3 -     Thiamine HCl; Take 1 tablet (100 mg total) by mouth daily.  Dispense: 90 tablet; Refill: 3 -     Cyanocobalamin; Inject 1 mL (1,000 mcg total) into the muscle once for 1 dose.  Dispense: 1 mL; Refill: 0  Bilateral impacted cerumen -     Debrox; Place 5 drops into both ears 2 (two) times daily.  Dispense: 15 mL; Refill: 0  History of colon polyps Overview: Path from 2017 colonoscopy 1. Surgical [P], ascending and cecum, polyps(3) - TUBULAR ADENOMAS. - NO HIGH GRADE DYSPLASIA OR MALIGNANCY IDENTIFIED. 2. Surgical [P], transverse, polyps(2) - TUBULAR ADENOMA AND HYPERPLASTIC COLONIC POLYP. - NO HIGH GRADE DYSPLASIA OR MALIGNANCY IDENTIFIED. 3. Surgical [P], rectum, sigmoid and descending, recto sigmoid, polyps(5) - TUBULAR ADENOMA AND HYPERPLASTIC COLONIC POLYPS. - NO HIGH GRADE DYSPLASIA OR MALIGNANCY IDENTIFIED.  Assessment & Plan: I explained that based on his pathology report from 2017 I think he should have a repeat colonoscopy soon and he would like to have it done in [place] so I am setting up a fresh referral is a lot closer than [facility]  Orders: -  Ambulatory referral to Gastroenterology  Palpitations Overview: He still has intermittent palpitations and 2012 evaluation by cardiologist Adam Phenix documented the tachybradycardia and possibly doing loop recorder versus seeing electrophysiology but he has not followed up in about 12 years so he would like to see Dr. Caryl Comes again about the ongoing issue  Assessment & Plan: I referred as requested  Orders: -     Ambulatory referral to Cardiology  Limping Overview: Keeps left leg straight when walking- says the problem is in the hip though the knee doesn't bend and there is no pain in either.  Assessment & Plan: Noticed this at end of appointment Hopefully vitamins will help and will follow up soon for  further evaluation    Alcoholic cirrhosis of liver with ascites ([place]) Overview: Presumed alcohol associated Speculative diagnosis based on stigmata (gynecomastia, asterixis, protuberant abdomen presumed ascitic)  Assessment & Plan: Liver imaging not yet completed. Encouraged alcohol cessation     Today's Health Maintenance Counseling and Anticipatory Guidance:  Eye exams:  Every 1-2 years was suggested Dental Health:  Reminded about importance of regular tooth brushing, flossing, and dental visits q6 months - he had all his teeth pulled and doesn't need. Allergic to dental glue so doesn't need. Sinus Hygiene:  Recommended daily sinus rinsing with sterile saline mist.  Cardiovascular Risk Factor Reduction:   Advised patient of need for regular exercise and diet rich and fruits and vegetables (high in fiber) and healthy fats (omega-3 unsaturated fats in fish/nuts/avocados/evoo) to reduce risk of heart attack and stroke. Avoid first and 2nd hand smoke and stimulants.   Avoid extreme exercise, only exercise in moderation Nutrition and Weight Management: recommended a diet to achieve a lean physique, in addition to a heart-healthy diet.  Avoid fattening foods with carbohydrates unless resistance training. Wt Readings from Last 3 Encounters:  11/24/22 186 lb 6.4 oz (84.6 kg)  04/10/16 139 lb (63 kg)  04/02/16 139 lb (63 kg)   Body mass index is 24.59 kg/m. /  Substance use:  I discussed my recommendation of total abstinence from all substances of abuse including smoke(including 2nd hand), alcohol, illicit drugs, inhalants, sugar.   Offered to assist with any substance use disorders or addictions.   STD screening: testing offered today, but patient declined as he considers himself to be low risk based on his sexual history   Sleep Apnea screening:  He  denies any significant problems with sleep quality or hypersomnolence or being advised that he has been told that he snores or has  apnea Depression screening:      02/18/2016    9:59 AM 06/21/2015    2:26 PM  Depression screen PHQ 2/9  Decreased Interest 0 0  Down, Depressed, Hopeless 0 0  PHQ - 2 Score 0 0   Counseled on value of gratitude exercises, mindfulness, physical exercise, social connections, and good sleep Injury prevention: Discussed safety belts, safety helmets, smoke detectors.  Avoid adventure sports and high impact exercises. Health maintenance and immunizations reviewed and he was encouraged to complete anything that is due: Immunization History  Administered Date(s) Administered   PFIZER(Purple Top)SARS-COV-2 Vaccination 02/19/2020, 03/21/2020   Tdap 06/21/2015   Health Maintenance Due  Topic Date Due   COLONOSCOPY (Pts 45-22yr Insurance coverage will need to be confirmed)  04/10/2017    Today's Cancer Screening Guidance: Penile & Testicular cancer screening:  He was advised to palpate his testicles, scrotum, and penis for masses and inform me of any.   Prostate cancer screening:  Denies family history of prostate cancer or hematospermia.  He reported finances are restrictive so PSA not ordered since insurance may not cover. Colon cancer screening:  overdue for surveillance agreeable to refer- referral made Thyroid cancer screening:  encouraged to self exam thyroid for nodules every 6-12 months  Skin cancer screening:  Advised regular sunscreen use.   He denies worrisome, changing, or new skin lesions.     Health Maintenance  Topic Date Due   COLONOSCOPY (Pts 45-53yr Insurance coverage will need to be confirmed)  04/10/2017   DTaP/Tdap/Td (2 - Td or Tdap) 06/20/2025   Hepatitis C Screening  Completed   HIV Screening  Completed   HPV VACCINES  Aged Out   INFLUENZA VACCINE  Discontinued   COVID-19 Vaccine  Discontinued   Zoster Vaccines- Shingrix  Discontinued    Subjective:  Patient presents today to establish care.  Chief Complaint  Patient presents with   EBalaton in right ear    Constant for about a week, can't hear well in that ear.   Discuss blood work    Fasting today, wants to make a decision after discussing with provider.     Problem-oriented charting was used to update the medical history: Problem  History of Colon Polyps   Path from 2017 colonoscopy 1. Surgical [P], ascending and cecum, polyps(3) - TUBULAR ADENOMAS. - NO HIGH GRADE DYSPLASIA OR MALIGNANCY IDENTIFIED. 2. Surgical [P], transverse, polyps(2) - TUBULAR ADENOMA AND HYPERPLASTIC COLONIC POLYP. - NO HIGH GRADE DYSPLASIA OR MALIGNANCY IDENTIFIED. 3. Surgical [P], rectum, sigmoid and descending, recto sigmoid, polyps(5) - TUBULAR ADENOMA AND HYPERPLASTIC COLONIC POLYPS. - NO HIGH GRADE DYSPLASIA OR MALIGNANCY IDENTIFIED.   Alcohol Dependence With Other Alcohol-Induced Disorder (Hcc)   He has a protuberant abdomen with fluid shift and asterixis with hand grab I informed him this is concerning for advanced liver disease   Tinnitus of Right Ear  Palpitations   He still has intermittent palpitations and 2012 evaluation by cardiologist SAdam Phenixdocumented the tachybradycardia and possibly doing loop recorder versus seeing electrophysiology but he has not followed up in about 12 years so he would like to see Dr. KCaryl Comesagain about the ongoing issue   Limping   Keeps left leg straight when walking- says the problem is in the hip though the knee doesn't bend and there is no pain in either.   Cirrhosis of Liver (Hcc)   Presumed alcohol associated Speculative diagnosis based on stigmata (gynecomastia, asterixis, protuberant abdomen presumed ascitic)   Healthcare Maintenance (Resolved)  Etoh Abuse (Resolved)     Depression Screen    02/18/2016    9:59 AM 06/21/2015    2:26 PM  PHQ 2/9 Scores  PHQ - 2 Score 0 0   No results found for any visits on 11/24/22.   The following were reviewed and entered/updated in epic: Past Medical History:  Diagnosis Date   Arrhythmia  12/22/2005   Arthritis    GERD (gastroesophageal reflux disease)    Hemorrhoid 12/22/1998   s/p banding    Limping 11/24/2022   Keeps left leg straight when walking- says the problem is in the hip though the knee doesn't bend and there is no pain in either.   Past Surgical History:  Procedure Laterality Date   CARDIOVERSION  2007   KNEE SURGERY Left 1996   Family History  Problem Relation Age of Onset   Arthritis Mother    Diabetes Mother    Heart disease Mother  Heart attack Mother    Cancer Father        kidney    Heart disease Father    Glaucoma Father    Kidney disease Father    Colon polyps Brother    Colon cancer Paternal Grandmother    Esophageal cancer Neg Hx    Rectal cancer Neg Hx    Stomach cancer Neg Hx    Outpatient Medications Prior to Visit  Medication Sig Dispense Refill   omeprazole (PRILOSEC) 40 MG capsule Take 40 mg by mouth daily.     acetaminophen (TYLENOL) 325 MG tablet Take 650 mg by mouth every 6 (six) hours as needed. (Patient not taking: Reported on 11/24/2022)     azithromycin (ZITHROMAX) 250 MG tablet Take 1 tablet (250 mg total) by mouth daily. Take first 2 tablets together, then 1 every day until finished. (Patient not taking: Reported on 11/24/2022) 6 tablet 0   loperamide (IMODIUM) 2 MG capsule Take by mouth as needed for diarrhea or loose stools. (Patient not taking: Reported on 11/24/2022)     predniSONE (DELTASONE) 20 MG tablet Take 2 tablets (40 mg total) by mouth daily. (Patient not taking: Reported on 11/24/2022) 10 tablet 0   No facility-administered medications prior to visit.    Allergies  Allergen Reactions   Codeine Rash   Social History   Tobacco Use   Smoking status: Some Days    Packs/day: 1.00    Years: 12.00    Total pack years: 12.00    Types: Cigarettes   Smokeless tobacco: Never  Vaping Use   Vaping Use: Never used  Substance Use Topics   Alcohol use: Yes    Alcohol/week: 4.0 standard drinks of alcohol     Types: 4 Cans of beer per week    Comment: 80 0z of beer 3 times a week    Drug use: Yes    Types: Marijuana    Comment: Smoked mariijuana last night    Immunization History  Administered Date(s) Administered   PFIZER(Purple Top)SARS-COV-2 Vaccination 02/19/2020, 03/21/2020   Tdap 06/21/2015   Review of Systems  Constitutional:  Negative for chills, diaphoresis, fever, malaise/fatigue and weight loss.  HENT:  Positive for tinnitus. Negative for congestion, ear discharge, ear pain, hearing loss, nosebleeds, sinus pain and sore throat.   Eyes:  Negative for blurred vision, double vision, photophobia, pain, discharge and redness.  Respiratory:  Negative for cough, hemoptysis, sputum production, shortness of breath, wheezing and stridor.   Cardiovascular:  Positive for palpitations. Negative for chest pain, orthopnea, claudication, leg swelling and PND.  Gastrointestinal:  Positive for heartburn. Negative for abdominal pain, blood in stool, constipation, diarrhea, melena, nausea and vomiting.  Genitourinary:  Negative for dysuria, flank pain, frequency, hematuria and urgency.  Musculoskeletal:  Negative for back pain, falls, joint pain, myalgias and neck pain.  Skin:  Negative for itching and rash.  Neurological:  Negative for dizziness, tingling, tremors, sensory change, speech change, focal weakness, seizures, loss of consciousness, weakness and headaches.  Endo/Heme/Allergies:  Negative for environmental allergies and polydipsia. Does not bruise/bleed easily.  Psychiatric/Behavioral:  Negative for depression, hallucinations, memory loss, substance abuse and suicidal ideas. The patient has insomnia. The patient is not nervous/anxious.       Objective:  BP 113/69 (BP Location: Right Arm, Patient Position: Sitting)   Pulse 74   Temp 98.4 F (36.9 C) (Temporal)   Resp 12   Ht '6\' 1"'$  (1.854 m)   Wt 186 lb 6.4 oz (84.6 kg)  SpO2 94%   BMI 24.59 kg/m  Body mass index is 24.59 kg/m.    Physical Exam Constitutional:      General: He is awake. He is not in acute distress.    Appearance: Normal appearance. He is well-developed. He is not ill-appearing, toxic-appearing or diaphoretic.  HENT:     Head: Normocephalic and atraumatic.     Nose: Nose normal.  Eyes:     General: Lids are normal. No scleral icterus.    Conjunctiva/sclera: Conjunctivae normal.  Cardiovascular:     Rate and Rhythm: Normal rate and regular rhythm.     Heart sounds: No murmur heard.    No friction rub. No gallop.  Pulmonary:     Effort: Pulmonary effort is normal. No respiratory distress or retractions.     Breath sounds: Normal breath sounds.  Abdominal:     General: There is no distension.     Palpations: There is no mass.     Tenderness: There is no abdominal tenderness.  Neurological:     General: No focal deficit present.     Mental Status: He is alert.  Psychiatric:        Mood and Affect: Mood normal.        Behavior: Behavior normal.        Thought Content: Thought content normal.        Judgment: Judgment normal.     Limps- keeping left leg straight- says its in his hip but it doesn't hurt Dip nodules on hands quite large Fluid wave in protuberant abdomen Asterixis with finger grab Bilateral cerumen hard impaction   Results Reviewed: Results for orders placed or performed in visit on 02/18/16  HgB A1c  Result Value Ref Range   Hemoglobin A1C 4.9

## 2022-11-24 NOTE — Patient Instructions (Addendum)
It was a pleasure seeing you today!  Your health and satisfaction are my top priorities. If you believe your experience today was worthy of a 5-star rating, I'd be grateful for your feedback! Loralee Pacas, MD   CHECKOUT CHECKLIST  '[]'$    Schedule next appointment(s):    Return in about 1 month (around 12/25/2022) for review response to recent changes.  Any requested lab visits should be scheduled as appointments too  If you are not doing well:  Return to the office sooner Please bring all your medicine bottles to each appointment If your condition begins to worsen or become severe:  go to the emergency room or even call 911  REMINDERS:  '[]'$  Please take the acamprosate tablets and the multivitamins regularly it should help you to reduce the alcohol and do not try to stop it too quickly them for about half to three quarters of what you were doing  '[]'$  Be sure to let the Debrox ear rinse sit in your ear for about 5 to 10 minutes before you rinse it out and then try to rinse it out more with soap and water in the shower  '[]'$    Review your lab results on MyChart  '[]'$   (Optional):  Review your clinical notes on MyChart after they are completed.     Today's draft of the physician documented plan for today's visit: (final revisions will be visible on MyChart chart later)     QUESTIONS & CONCERNS: CLINICAL: please contact us via phone [phone] OR MyChart messaging  LAB & IMAGING:   We will call you if the results are significantly abnormal or you don't use MyChart.  Most normal results will be posted to MyChart immediately and have a clinical review message by Dr. Randol Kern posted within 2-3 business days.   If you have not heard from [place] regarding the results in 2 weeks OR if you need priority reporting, please contact this office. MYCHART:  The fastest way to get your results and easiest way to stay in touch with [place] is by activating your My Chart account. Instructions are located on the last  page of this paperwork.  BILLING: xray and lab orders are billed from separate companies and questions./concerns should be directed to the [facility].  For visit charges please discuss with our administrative services COMPLAINTS:  please let Dr. Randol Kern know or see the [facility], by asking at the front desk: we want you to be satisfied with every experience and we would be grateful for the opportunity to address any problems   Preventive Care 60-52 Years Old, Male Preventive care refers to lifestyle choices and visits with your health care provider that can promote health and wellness. Preventive care visits are also called wellness exams. What can I expect for my preventive care visit? Counseling During your preventive care visit, your health care provider may ask about your: Medical history, including: Past medical problems. Family medical history. Current health, including: Emotional well-being. Home life and relationship well-being. Sexual activity. Lifestyle, including: Alcohol, nicotine or tobacco, and drug use. Access to firearms. Diet, exercise, and sleep habits. Safety issues such as seatbelt and bike helmet use. Sunscreen use. Work and work Statistician. Physical exam Your health care provider will check your: Height and weight. These may be used to calculate your BMI (body mass index). BMI is a measurement that tells if you are at a healthy weight. Waist circumference. This measures the distance around your  waistline. This measurement also tells if you are at a healthy weight and may help predict your risk of certain diseases, such as type 2 diabetes and high blood pressure. Heart rate and blood pressure. Body temperature. Skin for abnormal spots. What immunizations do I need?  Vaccines are usually given at various ages, according to a schedule. Your health care provider will recommend vaccines for you based on your  age, medical history, and lifestyle or other factors, such as travel or where you work. What tests do I need? Screening Your health care provider may recommend screening tests for certain conditions. This may include: Lipid and cholesterol levels. Diabetes screening. This is done by checking your blood sugar (glucose) after you have not eaten for a while (fasting). Hepatitis B test. Hepatitis C test. HIV (human immunodeficiency virus) test. STI (sexually transmitted infection) testing, if you are at risk. Lung cancer screening. Prostate cancer screening. Colorectal cancer screening. Talk with your health care provider about your test results, treatment options, and if necessary, the need for more tests. Follow these instructions at home: Eating and drinking  Eat a diet that includes fresh fruits and vegetables, whole grains, lean protein, and low-fat dairy products. Take vitamin and mineral supplements as recommended by your health care provider. Do not drink alcohol if your health care provider tells you not to drink. If you drink alcohol: Limit how much you have to 0-2 drinks a day. Know how much alcohol is in your drink. In the [place] one drink equals one 12 oz bottle of beer (355 mL), one 5 oz glass of wine (148 mL), or one 1 oz glass of hard liquor (44 mL). Lifestyle Brush your teeth every morning and night with fluoride toothpaste. Floss one time each day. Exercise for at least 30 minutes 5 or more days each week. Do not use any products that contain nicotine or tobacco. These products include cigarettes, chewing tobacco, and vaping devices, such as e-cigarettes. If you need help quitting, ask your health care provider. Do not use drugs. If you are sexually active, practice safe sex. Use a condom or other form of protection to prevent STIs. Take aspirin only as told by your health care provider. Make sure that you understand how much to take and what form to take. Work with your  health care provider to find out whether it is safe and beneficial for you to take aspirin daily. Find healthy ways to manage stress, such as: Meditation, yoga, or listening to music. Journaling. Talking to a trusted person. Spending time with friends and family. Minimize exposure to UV radiation to reduce your risk of skin cancer. Safety Always wear your seat belt while driving or riding in a vehicle. Do not drive: If you have been drinking alcohol. Do not ride with someone who has been drinking. When you are tired or distracted. While texting. If you have been using any mind-altering substances or drugs. Wear a helmet and other protective equipment during sports activities. If you have firearms in your house, make sure you follow all gun safety procedures. What's next? Go to your health care provider once a year for an annual wellness visit. Ask your health care provider how often you should have your eyes and teeth checked. Stay up to date on all vaccines. This information is not intended to replace advice given to you by your health care provider. Make sure you discuss any questions you have with your health care provider. Document Revised: 06/05/2021 Document Reviewed: 06/05/2021  Elsevier Patient Education  2023 [employer] Inc[employer]  

## 2022-11-24 NOTE — Telephone Encounter (Signed)
Pt completed his new pt appt today. He had another question he forgot to ask the dr. He is asking for a call back.

## 2022-11-24 NOTE — Progress Notes (Signed)
I have reviewed his recent testing.  Let him know.  The following abnormalities are noted: large red blood cells from vitamin deficiency caused by alcohol.  Low sodium from beer causing sodium loss (in the urine).  Signs of liver injury (elevated AST)_ from alcohol. Great cholesterol All other values are normal, stable, or within acceptable limits.  Medication changes / Follow up labs / Other changes or recommendations:   Stopping drinking should fix everything.  Loralee Pacas, MD  11/24/2022 6:24 PM   Understanding your Test Results:  IF you would like a more nuanced understanding of your testing and test results, please make an appointment for a detailed review and explanation of your tests.    This can be empowering!   If you don't have time for an appointment, you can still get pretty good explanations from online chat bots like ChatGPT.    If such a bot gives you a panic attack, please make an appointment with me to discuss why the bot is probably wrong.

## 2022-11-24 NOTE — Assessment & Plan Note (Addendum)
Liver imaging not yet completed. Encouraged alcohol cessation. He asked if alcohol cessation would be in time- I assured him it will improve things a lot and halt progression, but he may not recover damaged liver.

## 2022-11-24 NOTE — Assessment & Plan Note (Signed)
Noticed this at end of appointment Hopefully vitamins will help and will follow up soon for further evaluation

## 2022-11-24 NOTE — Assessment & Plan Note (Signed)
I referred as requested

## 2022-11-24 NOTE — Assessment & Plan Note (Signed)
Debrox rinses and soap and water rinses planned

## 2022-12-05 ENCOUNTER — Ambulatory Visit: Payer: Medicaid Other | Admitting: Cardiovascular Disease

## 2022-12-11 ENCOUNTER — Ambulatory Visit: Payer: Medicaid Other | Admitting: Cardiovascular Disease

## 2022-12-16 NOTE — Telephone Encounter (Signed)
Spoke with patient concerning this. 

## 2022-12-25 ENCOUNTER — Ambulatory Visit: Payer: Medicaid Other | Admitting: Internal Medicine

## 2022-12-29 ENCOUNTER — Ambulatory Visit: Payer: Medicaid Other | Attending: Cardiovascular Disease | Admitting: Cardiovascular Disease

## 2022-12-31 ENCOUNTER — Encounter: Payer: Self-pay | Admitting: Internal Medicine

## 2022-12-31 ENCOUNTER — Ambulatory Visit (INDEPENDENT_AMBULATORY_CARE_PROVIDER_SITE_OTHER): Payer: Medicaid Other | Admitting: Internal Medicine

## 2022-12-31 VITALS — BP 114/72 | HR 99 | Temp 98.2°F | Ht 73.0 in | Wt 182.4 lb

## 2022-12-31 DIAGNOSIS — H9311 Tinnitus, right ear: Secondary | ICD-10-CM

## 2022-12-31 DIAGNOSIS — H6123 Impacted cerumen, bilateral: Secondary | ICD-10-CM

## 2022-12-31 DIAGNOSIS — R2689 Other abnormalities of gait and mobility: Secondary | ICD-10-CM | POA: Diagnosis not present

## 2022-12-31 DIAGNOSIS — K7031 Alcoholic cirrhosis of liver with ascites: Secondary | ICD-10-CM

## 2022-12-31 DIAGNOSIS — F10288 Alcohol dependence with other alcohol-induced disorder: Secondary | ICD-10-CM

## 2022-12-31 DIAGNOSIS — H6120 Impacted cerumen, unspecified ear: Secondary | ICD-10-CM

## 2022-12-31 DIAGNOSIS — Z720 Tobacco use: Secondary | ICD-10-CM

## 2022-12-31 HISTORY — DX: Impacted cerumen, unspecified ear: H61.20

## 2022-12-31 MED ORDER — ACAMPROSATE CALCIUM 333 MG PO TBEC: 666.0000 mg | DELAYED_RELEASE_TABLET | Freq: Three times a day (TID) | ORAL | 3 refills | Status: DC

## 2022-12-31 NOTE — Assessment & Plan Note (Signed)
We will irrigate and clean the ears today procedure note as follows  PROCEDURE: CERUMEN DISIMPACTION   Otoscopic viewing of the tympanic membrane was initially obstructed by copious impacted cerumen in the external auditory canal, so disimpaction by irrigation was recommended.  The associated and risk of tympanic membrane perforation was discussed and verbal consent was obtained prior to performing the procedure. The affected bilateral auditory canal(s) were then irrigated by gentle ear lavage with successful removal of impacted cerumen as confirmed on post procedural repeat otoscopy.   Also, the tympanic membranes appeared to still be intact immediately following the procedure, while auditory canals were inflamed.   Patient tolerated well without complications.    After the procedure, the patient reported: some relief

## 2022-12-31 NOTE — Assessment & Plan Note (Addendum)
Today I offered to do an elastogram of his liver mainly to support his desire to quit drinking but he reports he is intent to quit is absolute and so were not can order this today because it is not likely to change management other than to affect his desire to quit.  If he struggles to quit we will revisit this and maybe do it then

## 2022-12-31 NOTE — Assessment & Plan Note (Signed)
Suspect this is due to inflammation of right ear from chronic impacted cerumen- and should resolve with further efforts to clear it up.

## 2022-12-31 NOTE — Assessment & Plan Note (Signed)
Continue not smoking I offered to give him nicotine replacement therapies if he is having a lot of cravings or to take Chantix which can help prevent any joy from smoking if he goes back to it but he feels confident he is not going back to it so he declines any medical support.  I applauded his decision to discontinue smoking and educated him that this will reduce his risk of lung cancer heart attack stroke and peripheral vascular disease.

## 2022-12-31 NOTE — Assessment & Plan Note (Addendum)
I let him know I am super proud of him for his decision to quit drinking and intention to do so it seems like he is already cut back almost completely stopped had a whole week sober now.  I encouraged him to keep it up and if he struggles I encouraged him to go to a 12-step program such as Alcoholics Anonymous to try to get remotivated.  I encouraged him to keep taking the acamprosate which she reports he is taking consistently and seems to be helping.  I discussed the option of adding on gabapentin if he is having a lot of cravings-this is an anticonvulsant medication that works a little bit like alcohol to that might reduce cravings and reduce risk of alcohol withdrawal seizures so I think it is helpful but it is off label use it is not FDA approved for this treatment.  My current impression is that he will stay sober for quite some time right now due to his acute awareness of the health risks at [place].  I advised him that despite that strong motivation people places and things known as triggers will undermine even the best efforts.  Is currently reporting absolutely no cravings so organ to leave the gabapentin off for now but I encouraged him to come see me again right away if he starts to get cravings or get triggered by something to use I will always be on his side and never to be judging about it

## 2022-12-31 NOTE — Assessment & Plan Note (Signed)
PROCEDURE: CERUMEN DISIMPACTION   Otoscopic viewing of the tympanic membrane was initially obstructed by copious impacted cerumen in the external auditory canal, so disimpaction by irrigation was recommended.  The associated and risk of tympanic membrane perforation was discussed and verbal consent was obtained prior to performing the procedure. The affected bilateral auditory canal(s) were then irrigated by gentle ear lavage with partially successful removal of impacted cerumen as confirmed on post procedural repeat otoscopy.  Due to some retained cerumen in R ear he was advised to return to clinic in 1-4 week(s) after further debrox treatment, and reattempt- the cerume was impacted onto the tympanic membrane so instrumentation was deemed too risky in my medical opinion.  Also, the tympanic membrane on left appeared to still be intact immediately following the procedure, while auditory canals were inflamed.   Patient tolerated well without complications.    After the procedure, the patient reported: no relief

## 2022-12-31 NOTE — Assessment & Plan Note (Signed)
He has a remote history of arthroscopy I offered to do x-rays of the hip or physical therapy and he is not interested at this time.

## 2022-12-31 NOTE — Patient Instructions (Addendum)
It was a pleasure seeing you today!  Your health and satisfaction are my top priorities. If you believe your experience today was worthy of a 5-star rating, I'd be grateful for your feedback! Loralee Pacas, MD   CHECKOUT CHECKLIST  '[]'$    Schedule next appointment(s):    Return in about 1 month (around 01/31/2023) for f/u tinnitus, recovery alc and cig, maybe repeat labs..  Any requested lab visits should be scheduled as appointments too  If you are not doing well:  Return to the office sooner Please bring all your medicine bottles to each appointment If your condition begins to worsen or become severe:  go to the emergency room or even call 911   '[]'$   (Optional):  Review your clinical notes on MyChart after they are completed.     Today's draft of the physician documented plan for today's visit: (final revisions will be visible on MyChart chart later) Tinnitus of right ear Assessment & Plan: Suspect this is due to inflammation of right ear from chronic impacted cerumen- and should resolve with further efforts to clear it up.   Bilateral impacted cerumen Assessment & Plan: PROCEDURE: CERUMEN DISIMPACTION   Otoscopic viewing of the tympanic membrane was initially obstructed by copious impacted cerumen in the external auditory canal, so disimpaction by irrigation was recommended.  The associated and risk of tympanic membrane perforation was discussed and verbal consent was obtained prior to performing the procedure. The affected bilateral auditory canal(s) were then irrigated by gentle ear lavage with partially successful removal of impacted cerumen as confirmed on post procedural repeat otoscopy.  Due to some retained cerumen in R ear he was advised to return to clinic in 1-4 week(s) after further debrox treatment, and reattempt- the cerume was impacted onto the tympanic membrane so instrumentation was deemed too risky in my medical opinion.  Also, the tympanic membrane on left appeared to  still be intact immediately following the procedure, while auditory canals were inflamed.   Patient tolerated well without complications.    After the procedure, the patient reported: no relief       Limping Assessment & Plan: He has a remote history of arthroscopy I offered to do x-rays of the hip or physical therapy and he is not interested at this time.     Alcohol dependence with other alcohol-induced disorder [facility]) Assessment & Plan: I let him know I am super proud of him for his decision to quit drinking and intention to do so it seems like he is already cut back almost completely stopped had a whole week sober now.  I encouraged him to keep it up and if he struggles I encouraged him to go to a 12-step program such as Alcoholics Anonymous to try to get remotivated.  I encouraged him to keep taking the acamprosate which she reports he is taking consistently and seems to be helping.  I discussed the option of adding on gabapentin if he is having a lot of cravings-this is an anticonvulsant medication that works a little bit like alcohol to that might reduce cravings and reduce risk of alcohol withdrawal seizures so I think it is helpful but it is off label use it is not FDA approved for this treatment.  My current impression is that he will stay sober for quite some time right now due to his acute awareness of the health risks at [place].  I advised him that despite that strong motivation people places and things known as triggers will  undermine even the best efforts.  Is currently reporting absolutely no cravings so organ to leave the gabapentin off for now but I encouraged him to come see me again right away if he starts to get cravings or get triggered by something to use I will always be on his side and never to be judging about it  Orders: -     Acamprosate Calcium; Take 2 tablets (666 mg total) by mouth 3 (three) times daily with meals.  Dispense: 540 tablet; Refill: 3  Tobacco  abuse Assessment & Plan: Continue not smoking I offered to give him nicotine replacement therapies if he is having a lot of cravings or to take Chantix which can help prevent any joy from smoking if he goes back to it but he feels confident he is not going back to it so he declines any medical support.  I applauded his decision to discontinue smoking and educated him that this will reduce his risk of lung cancer heart attack stroke and peripheral vascular disease.   Alcoholic cirrhosis of liver with ascites [facility]) Assessment & Plan: Today I offered to do an elastogram of his liver mainly to support his desire to quit drinking but he reports he is intent to quit is absolute and so were not can order this today because it is not likely to change management other than to affect his desire to quit.  If he struggles to quit we will revisit this and maybe do it then       [place]: CLINICAL: please contact us via phone [phone] OR MyChart messaging  LAB & IMAGING:   We will call you if the results are significantly abnormal or you don't use MyChart.  Most normal results will be posted to MyChart immediately and have a clinical review message by Dr. Randol Kern posted within 2-3 business days.   If you have not heard from [place] regarding the results in 2 weeks OR if you need priority reporting, please contact this office. MYCHART:  The fastest way to get your results and easiest way to stay in touch with [place] is by activating your My Chart account. Instructions are located on the last page of this paperwork.  BILLING: xray and lab orders are billed from separate companies and questions./concerns should be directed to the [facility].  For visit charges please discuss with our administrative services COMPLAINTS:  please let Dr. Randol Kern know or see the [facility], by asking at the front desk: we want you to be satisfied with every experience  and we would be grateful for the opportunity to address any problems

## 2022-12-31 NOTE — Progress Notes (Signed)
[name]: [phone]   Routine Medical Office Visit  Patient:  Derek [facility]      Age: 62 y.o.       Sex:  male  Date:   12/31/2022  PCP:    Loralee Pacas, [place] Provider: Loralee Pacas, MD   Problem Focused Charting:   Medical Decision Making per Assessment/Plan   Tonio was seen today for follow-up, tinnitus, alcohol problem, nicotine dependence and elevated hepatic enzymes.  Tinnitus of right ear Overview: Associated with cerumen impaction  Assessment & Plan: Suspect this is due to inflammation of right ear from chronic impacted cerumen- and should resolve with further efforts to clear it up.   Bilateral impacted cerumen Assessment & Plan: PROCEDURE: CERUMEN DISIMPACTION   Otoscopic viewing of the tympanic membrane was initially obstructed by copious impacted cerumen in the external auditory canal, so disimpaction by irrigation was recommended.  The associated and risk of tympanic membrane perforation was discussed and verbal consent was obtained prior to performing the procedure. The affected bilateral auditory canal(s) were then irrigated by gentle ear lavage with partially successful removal of impacted cerumen as confirmed on post procedural repeat otoscopy.  Due to some retained cerumen in R ear he was advised to return to clinic in 1-4 week(s) after further debrox treatment, and reattempt- the cerume was impacted onto the tympanic membrane so instrumentation was deemed too risky in my medical opinion.  Also, the tympanic membrane on left appeared to still be intact immediately following the procedure, while auditory canals were inflamed.   Patient tolerated well without complications.    After the procedure, the patient reported: no relief       Limping Overview: Keeps left leg straight when walking- says the problem is in the hip though the knee doesn't bend and there is no pain in either.  Assessment &  Plan: He has a remote history of arthroscopy I offered to do x-rays of the hip or physical therapy and he is not interested at this time.     Alcohol dependence with other alcohol-induced disorder ([place]) Overview: He initially had a protuberant abdomen with fluid shift and asterixis I informed him this is concerning for advanced liver disease but after stopping drinking all these problems seemed to dramatically improve He had elevated ast/alternative (alcoholic hepatitis)   Assessment & Plan: I let him know I am super proud of him for his decision to quit drinking and intention to do so it seems like he is already cut back almost completely stopped had a whole week sober now.  I encouraged him to keep it up and if he struggles I encouraged him to go to a 12-step program such as Alcoholics Anonymous to try to get remotivated.  I encouraged him to keep taking the acamprosate which she reports he is taking consistently and seems to be helping.  I discussed the option of adding on gabapentin if he is having a lot of cravings-this is an anticonvulsant medication that works a little bit like alcohol to that might reduce cravings and reduce risk of alcohol withdrawal seizures so I think it is helpful but it is off label use it is not FDA approved for this treatment.  My current impression is that he will stay sober for quite some time right now due to his acute awareness of the health risks at [place].  I advised him that despite that strong motivation people places and things known as triggers  will undermine even the best efforts.  Is currently reporting absolutely no cravings so organ to leave the gabapentin off for now but I encouraged him to come see me again right away if he starts to get cravings or get triggered by something to use I will always be on his side and never to be judging about it  Orders: -     Acamprosate Calcium; Take 2 tablets (666 mg total) by mouth 3 (three) times daily with meals.   Dispense: 540 tablet; Refill: 3  Tobacco abuse Overview: Smokes 1/3 ppd patient reports 11/24/22 Discontinued cly 1 week prior to 12/31/22 appointment   Assessment & Plan: Continue not smoking I offered to give him nicotine replacement therapies if he is having a lot of cravings or to take Chantix which can help prevent any joy from smoking if he goes back to it but he feels confident he is not going back to it so he declines any medical support.  I applauded his decision to discontinue smoking and educated him that this will reduce his risk of lung cancer heart attack stroke and peripheral vascular disease.   Alcoholic cirrhosis of liver with ascites ([facility]) Overview: Presumed alcohol associated Speculative diagnosis based on stigmata (gynecomastia, asterixis, protuberant abdomen presumed ascitic)  Assessment & Plan: Today I offered to do an elastogram of his liver mainly to support his desire to quit drinking but he reports he is intent to quit is absolute and so were not can order this today because it is not likely to change management other than to affect his desire to quit.  If he struggles to quit we will revisit this and maybe do it then    He will return in 1 month(s) (offered 1 week) to continue with efforts to clear cerumen and maintain sobriety/recovery   Subjective - Clinical Presentation:   Derek [facility] is a 62 y.o. male  Patient Active Problem List   Diagnosis Date Noted   Cerumen impaction 12/31/2022   History of colon polyps 11/24/2022   Alcohol dependence with other alcohol-induced disorder ([place]) 11/24/2022   Tinnitus of right ear 11/24/2022   Palpitations 11/24/2022   Limping 11/24/2022   Cirrhosis of liver ([place]) 11/24/2022   Bilateral impacted cerumen 11/24/2022   Distal interphalangeal nodule 11/24/2022   Gynecomastia, male 05/11/2015   Hemorrhoid    Tobacco abuse 11/22/2012   Past Medical History:  Diagnosis Date   Arrhythmia 12/22/2005    Arthritis    Cerumen impaction 12/31/2022   He has ringing in the ear ear and persistently impacted cerumen that did not resolve with Debrox treatment although it did soften up   GERD (gastroesophageal reflux disease)    Hemorrhoid 12/22/1998   s/p banding    Limping 11/24/2022   Keeps left leg straight when walking- says the problem is in the hip though the knee doesn't bend and there is no pain in either.    Outpatient Medications Prior to Visit  Medication Sig   carbamide peroxide (DEBROX) 6.5 % OTIC solution Place 5 drops into both ears 2 (two) times daily.   multivitamin (ONE-A-DAY MEN'S) TABS tablet Take 1 tablet by mouth daily.   omeprazole (PRILOSEC) 40 MG capsule Take 40 mg by mouth daily.   thiamine (VITAMIN B1) 100 MG tablet Take 1 tablet (100 mg total) by mouth daily.   [DISCONTINUED] acamprosate (CAMPRAL) 333 MG tablet Take 2 tablets (666 mg total) by mouth 3 (three) times daily with meals.   No facility-administered  medications prior to visit.    Chief Complaint  Patient presents with   Follow-up   Tinnitus   Alcohol Problem   Nicotine Dependence   Elevated Hepatic Enzymes    HPI  Patient reports he is totally stopped alcohol 1 week Totally stopped cigarettes  Still has ear ringing- debrox being used without resolution Understands the liver injury from labs- so is not going to drink anymore.         Objective:  Physical Exam  BP 114/72 (BP Location: Right Arm, Patient Position: Sitting)   Pulse 99   Temp 98.2 F (36.8 C) (Temporal)   Ht '6\' 1"'$  (1.854 m)   Wt 182 lb 6.4 oz (82.7 kg)   SpO2 97%   BMI 24.06 kg/m  Well developed, well nourished  by BMI criteria but truncal adiposity (waist circumference or caliper) should be used instead. Wt Readings from Last 10 Encounters:  12/31/22 182 lb 6.4 oz (82.7 kg)  11/24/22 186 lb 6.4 oz (84.6 kg)  04/10/16 139 lb (63 kg)  04/02/16 139 lb (63 kg)  02/18/16 143 lb (64.9 kg)  06/21/15 150 lb (68 kg)  05/11/15  148 lb (67.1 kg)  05/01/15 155 lb (70.3 kg)  04/30/15 150 lb (68 kg)  11/16/12 146 lb 11.2 oz (66.5 kg)   Vital signs reviewed.  Nursing notes reviewed. Weight trend reviewed. General Appearance:  Well developed, well nourished male in no acute distress.   Normal work of breathing at rest Musculoskeletal: All extremities are intact.  Neurological:  Awake, alert,  No obvious focal neurological deficits or cognitive impairments Psychiatric:  Appropriate mood, pleasant demeanor Problem-specific findings:  biateral cerumen impaction irrigated see note. Less abdomen protruberance, no asterixis anymroe   Results Reviewed: No results found for any visits on 12/31/22.  Recent Results (from the past 2160 hour(s))  CBC w/Diff     Status: Abnormal   Collection Time: 11/24/22 10:14 AM  Result Value Ref Range   WBC 5.0 4.0 - 10.5 K/uL   RBC 4.02 (L) 4.22 - 5.81 Mil/uL   Hemoglobin 13.8 13.0 - 17.0 g/dL   HCT 40.5 39.0 - 52.0 %   MCV 100.8 (H) 78.0 - 100.0 fl   MCHC 34.1 30.0 - 36.0 g/dL   RDW 14.0 11.5 - 15.5 %   Platelets 265.0 150.0 - 400.0 K/uL   Neutrophils Relative % 46.2 43.0 - 77.0 %   Lymphocytes Relative 35.0 12.0 - 46.0 %   Monocytes Relative 15.7 (H) 3.0 - 12.0 %   Eosinophils Relative 1.9 0.0 - 5.0 %   Basophils Relative 1.2 0.0 - 3.0 %   Neutro Abs 2.3 1.4 - 7.7 K/uL   Lymphs Abs 1.7 0.7 - 4.0 K/uL   Monocytes Absolute 0.8 0.1 - 1.0 K/uL   Eosinophils Absolute 0.1 0.0 - 0.7 K/uL   Basophils Absolute 0.1 0.0 - 0.1 K/uL  Comp Met (CMET)     Status: Abnormal   Collection Time: 11/24/22 10:14 AM  Result Value Ref Range   Sodium 134 (L) 135 - 145 mEq/L   Potassium 5.1 3.5 - 5.1 mEq/L   Chloride 98 96 - 112 mEq/L   CO2 28 19 - 32 mEq/L   Glucose, Bld 82 70 - 99 mg/dL   BUN 6 6 - 23 mg/dL   Creatinine, Ser 0.81 0.40 - 1.50 mg/dL   Total Bilirubin 0.4 0.2 - 1.2 mg/dL   Alkaline Phosphatase 81 39 - 117 U/L   AST 51 (H) 0 -  37 U/L   ALT 44 0 - 53 U/L   Total Protein 7.0 6.0 -  8.3 g/dL   Albumin 4.5 3.5 - 5.2 g/dL   GFR 94.89 >60.00 mL/min    Comment: Calculated using the CKD-EPI Creatinine Equation (2021)   Calcium 9.6 8.4 - 10.5 mg/dL  Lipid panel     Status: Abnormal   Collection Time: 11/24/22 10:14 AM  Result Value Ref Range   Cholesterol 219 (H) 0 - 200 mg/dL    Comment: ATP III Classification       Desirable:  < 200 mg/dL               Borderline High:  200 - 239 mg/dL          High:  > = 240 mg/dL   Triglycerides 48.0 0.0 - 149.0 mg/dL    Comment: Normal:  <150 mg/dLBorderline High:  150 - 199 mg/dL   HDL 113.20 >39.00 mg/dL   VLDL 9.6 0.0 - 40.0 mg/dL   LDL Cholesterol 96 0 - 99 mg/dL   Total CHOL/HDL Ratio 2     Comment:                Men          Women1/2 Average Risk     3.4          3.3Average Risk          5.0          4.42X Average Risk          9.6          7.13X Average Risk          15.0          11.0                       NonHDL 105.94     Comment: NOTE:  Non-HDL goal should be 30 mg/dL higher than patient's LDL goal (i.e. LDL goal of < 70 mg/dL, would have non-HDL goal of < 100 mg/dL)          Signed: Loralee Pacas, MD 12/31/2022 5:52 PM

## 2023-01-13 ENCOUNTER — Encounter: Payer: Self-pay | Admitting: Cardiology

## 2023-01-13 NOTE — Progress Notes (Deleted)
Cardiology Office Note   Date:  01/13/2023   ID:  Derek Bullock, DOB 21-Jan-1961, MRN [id number]  PCP:  Loralee Pacas, MD  Cardiologist:   None Referring:  ***  No chief complaint on file.     History of Present Illness: Derek Bullock is a 62 y.o. male who presents for evaluation of palpitations:  ***     Past Medical History:  Diagnosis Date   Arrhythmia 12/22/2005   Arthritis    Cerumen impaction 12/31/2022   He has ringing in the ear ear and persistently impacted cerumen that did not resolve with Debrox treatment although it did soften up   GERD (gastroesophageal reflux disease)    Hemorrhoid 12/22/1998   s/p banding    Limping 11/24/2022   Keeps left leg straight when walking- says the problem is in the hip though the knee doesn't bend and there is no pain in either.    Past Surgical History:  Procedure Laterality Date   CARDIOVERSION  2007   KNEE SURGERY Left 1996     Current Outpatient Medications  Medication Sig Dispense Refill   acamprosate (CAMPRAL) 333 MG tablet Take 2 tablets (666 mg total) by mouth 3 (three) times daily with meals. 540 tablet 3   carbamide peroxide (DEBROX) 6.5 % OTIC solution Place 5 drops into both ears 2 (two) times daily. 15 mL 0   multivitamin (ONE-A-DAY MEN'S) TABS tablet Take 1 tablet by mouth daily. 90 tablet 3   omeprazole (PRILOSEC) 40 MG capsule Take 40 mg by mouth daily.     thiamine (VITAMIN B1) 100 MG tablet Take 1 tablet (100 mg total) by mouth daily. 90 tablet 3   No current facility-administered medications for this visit.    Allergies:   Codeine    Social History:  The patient  reports that he has been smoking cigarettes. He has a 12.00 pack-year smoking history. He has never used smokeless tobacco. He reports current alcohol use of about 4.0 standard drinks of alcohol per week. He reports current drug use. Drug: Marijuana.   Family History:  The patient's ***family history includes Arthritis  in his mother; Cancer in his father; Colon cancer in his paternal grandmother; Colon polyps in his brother; Diabetes in his mother; Glaucoma in his father; Heart attack in his mother; Heart disease in his father and mother; Kidney disease in his father.    ROS:  Please see the history of present illness.   Otherwise, review of systems are positive for {NONE DEFAULTED:18576}.   All other systems are reviewed and negative.    PHYSICAL EXAM: VS:  There were no vitals taken for this visit. , BMI There is no height or weight on file to calculate BMI. GENERAL:  Well appearing HEENT:  Pupils equal round and reactive, fundi not visualized, oral mucosa unremarkable NECK:  No jugular venous distention, waveform within normal limits, carotid upstroke brisk and symmetric, no bruits, no thyromegaly LYMPHATICS:  No cervical, inguinal adenopathy LUNGS:  Clear to auscultation bilaterally BACK:  No CVA tenderness CHEST:  Unremarkable HEART:  PMI not displaced or sustained,S1 and S2 within normal limits, no S3, no S4, no clicks, no rubs, *** murmurs ABD:  Flat, positive bowel sounds normal in frequency in pitch, no bruits, no rebound, no guarding, no midline pulsatile mass, no hepatomegaly, no splenomegaly EXT:  2 plus pulses throughout, no edema, no cyanosis no clubbing SKIN:  No rashes no nodules NEURO:  Cranial nerves II through XII  grossly intact, motor grossly intact throughout [facility]:  Cognitively intact, oriented to person place and time    EKG:  EKG {ACTION; IS/IS GI:[id number] ordered today. The ekg ordered today demonstrates ***   Recent Labs: 11/24/2022: ALT 44; BUN 6; Creatinine, Ser 0.81; Hemoglobin 13.8; Platelets 265.0; Potassium 5.1; Sodium 134    Lipid Panel    Component Value Date/Time   CHOL 219 (H) 11/24/2022 1014   TRIG 48.0 11/24/2022 1014   HDL 113.20 11/24/2022 1014   CHOLHDL 2 11/24/2022 1014   VLDL 9.6 11/24/2022 1014   LDLCALC 96 11/24/2022 1014      Wt Readings  from Last 3 Encounters:  12/31/22 182 lb 6.4 oz (82.7 kg)  11/24/22 186 lb 6.4 oz (84.6 kg)  04/10/16 139 lb (63 kg)      Other studies Reviewed: Additional studies/ records that were reviewed today include: ***. Review of the above records demonstrates:  Please see elsewhere in the note.  ***   ASSESSMENT AND PLAN:  ***   Current medicines are reviewed at length with the patient today.  The patient {ACTIONS; HAS/DOES NOT HAVE:19233} concerns regarding medicines.  The following changes have been made:  {PLAN; NO CHANGE:13088:s}  Labs/ tests ordered today include: *** No orders of the defined types were placed in this encounter.    Disposition:   FU with ***    Signed, Minus Breeding, MD  01/13/2023 4:11 PM    [place]

## 2023-01-15 ENCOUNTER — Ambulatory Visit: Payer: Medicaid Other | Admitting: General Practice

## 2023-01-15 ENCOUNTER — Ambulatory Visit: Payer: Medicaid Other | Admitting: Cardiology

## 2023-01-15 DIAGNOSIS — R002 Palpitations: Secondary | ICD-10-CM

## 2023-01-29 ENCOUNTER — Ambulatory Visit: Payer: Medicaid Other | Admitting: Cardiology

## 2023-01-30 ENCOUNTER — Ambulatory Visit: Payer: Medicaid Other | Admitting: Internal Medicine

## 2023-02-04 ENCOUNTER — Telehealth: Payer: Self-pay | Admitting: Internal Medicine

## 2023-02-04 ENCOUNTER — Other Ambulatory Visit: Payer: Self-pay

## 2023-02-04 ENCOUNTER — Ambulatory Visit (INDEPENDENT_AMBULATORY_CARE_PROVIDER_SITE_OTHER): Payer: Medicaid Other | Admitting: Internal Medicine

## 2023-02-04 ENCOUNTER — Encounter: Payer: Self-pay | Admitting: Internal Medicine

## 2023-02-04 VITALS — BP 112/71 | HR 72 | Temp 98.1°F | Resp 16 | Ht 73.0 in | Wt 182.8 lb

## 2023-02-04 DIAGNOSIS — K219 Gastro-esophageal reflux disease without esophagitis: Secondary | ICD-10-CM

## 2023-02-04 DIAGNOSIS — R2689 Other abnormalities of gait and mobility: Secondary | ICD-10-CM | POA: Diagnosis not present

## 2023-02-04 DIAGNOSIS — K7031 Alcoholic cirrhosis of liver with ascites: Secondary | ICD-10-CM | POA: Diagnosis not present

## 2023-02-04 DIAGNOSIS — Z8701 Personal history of pneumonia (recurrent): Secondary | ICD-10-CM

## 2023-02-04 DIAGNOSIS — M151 Heberden's nodes (with arthropathy): Secondary | ICD-10-CM | POA: Diagnosis not present

## 2023-02-04 DIAGNOSIS — R002 Palpitations: Secondary | ICD-10-CM | POA: Diagnosis not present

## 2023-02-04 DIAGNOSIS — E785 Hyperlipidemia, unspecified: Secondary | ICD-10-CM

## 2023-02-04 DIAGNOSIS — Z87891 Personal history of nicotine dependence: Secondary | ICD-10-CM

## 2023-02-04 MED ORDER — PEPCID COMPLETE 10-800-165 MG PO CHEW: 1.0000 | CHEWABLE_TABLET | Freq: Every day | ORAL | 11 refills | Status: AC | PRN

## 2023-02-04 MED ORDER — OMEPRAZOLE 20 MG PO CPDR: 20.0000 mg | DELAYED_RELEASE_CAPSULE | Freq: Every day | ORAL | 3 refills | Status: DC

## 2023-02-04 MED ORDER — PEPCID COMPLETE 10-800-165 MG PO CHEW
1.0000 | CHEWABLE_TABLET | Freq: Every day | ORAL | 11 refills | Status: DC | PRN
  Filled 2023-02-04: qty 100, 100d supply, fill #0

## 2023-02-04 MED ORDER — OMEPRAZOLE 20 MG PO CPDR
20.0000 mg | DELAYED_RELEASE_CAPSULE | Freq: Every day | ORAL | 3 refills | Status: DC
  Filled 2023-02-04: qty 30, 30d supply, fill #0

## 2023-02-04 NOTE — Telephone Encounter (Signed)
Patient states medications from today's OV were sent to wrong pharmacy. The omeprazole and famotidine-calcium needs to be sent to [facility] in [place].

## 2023-02-04 NOTE — Assessment & Plan Note (Signed)
Assessed he will ignore

## 2023-02-04 NOTE — Telephone Encounter (Signed)
Meds were sent to per pharmacy

## 2023-02-04 NOTE — Assessment & Plan Note (Signed)
Re-evaluated, seems no worse than prior, he thinks present over 10 years cause is unknown and unclear from exam.   Encouraged taking B12 and will get physical therapy input

## 2023-02-04 NOTE — Assessment & Plan Note (Signed)
Seems like we can expect improvement since he has stopped drinking totally Also advised limited tylenol usage Liver monitoring in future possibly gastrointestinal referral. He declined ultrasound repeat since he plans to avoid alcohol Fatty liver noted on old ultrasound Encouraged file for [employer] benefits online

## 2023-02-04 NOTE — Assessment & Plan Note (Signed)
Encouraged him to get they are the vaccine and the Prevnar 20 both of which I think are great vaccines encouraged him to continue not smoking or drinking and applauded him for doing a great job quitting

## 2023-02-04 NOTE — Assessment & Plan Note (Signed)
I advised Mr. Derek Bullock that the omeprazole 40 mg that he has been taking to prevent reflux for the last 2 to 3 years will cause vitamin deficiencies and contribute to dementia if it is continued long-term.  I therefore recommend we cut back the dose just a little bit and if he gets any heartburn as a result of that take Pepcid Complete for the breakthrough heartburn I sent in Pepcid Complete to help with that as well as the lower dose of the heartburn pill and I put these instructions on his after visit summary to help him understand how to slowly reduce the dose because there is terrible rebound heartburn

## 2023-02-04 NOTE — Patient Instructions (Signed)
I advised Mr. Derek Bullock that the omeprazole 40 mg that he has been taking to prevent reflux for the last 2 to 3 years will cause vitamin deficiencies and contribute to dementia if it is continued long-term.  I therefore recommend we cut back the dose just a little bit and if he gets any heartburn as a result of that take Pepcid Complete for the breakthrough heartburn I sent in Pepcid Complete to help with that as well as the lower dose of the heartburn pill and I put these instructions on his after visit summary to help him understand how to slowly reduce the dose because there is terrible rebound heartburn

## 2023-02-04 NOTE — Progress Notes (Signed)
Derek Bullock [facility]: [id number]   Routine Medical Office Visit  Patient:  Derek Bullock      Age: 62 y.o.       Sex:  male  Date:   02/04/2023  PCP:    Derek Bullock, [place] Provider: Loralee Pacas, MD   Problem Focused Charting:   Medical Decision Making per Assessment/Plan   Dramatic improvements in health- has stopped drinking and smoking.    Derek Bullock was seen today for 1 month follow-up.  Limping Overview: Keeps left leg straight when walking- says the problem is in the hip though the knee doesn't bend and there is no pain in either.  Assessment & Plan: Re-evaluated, seems no worse than prior, he thinks present over 10 years cause is unknown and unclear from exam.   Encouraged taking B12 and will get physical therapy input  Orders: -     Ambulatory referral to Physical Therapy  Distal interphalangeal nodule Overview: Painted all his life Arthritis hurts every nnow and then  Assessment & Plan: Assessed he will ignore   Alcoholic cirrhosis of liver with ascites (Derek Bullock) Overview: Presumed alcohol associated Speculative diagnosis based on stigmata (gynecomastia, asterixis, protuberant abdomen presumed ascitic)  Assessment & Plan: Seems like we can expect improvement since he has stopped drinking totally Also advised limited tylenol usage Liver monitoring in future possibly gastrointestinal referral. He declined ultrasound repeat since he plans to avoid alcohol Fatty liver noted on old ultrasound Encouraged file for [employer] benefits online   Palpitations Overview: He still has intermittent palpitations and 2012 evaluation by cardiologist Derek Bullock documented the tachybradycardia and possibly doing loop recorder versus seeing electrophysiology but he has not followed up in about 12 years   History of smoking 25-50 pack years -     CT CHEST LUNG CANCER SCREENING LOW DOSE WO CONTRAST; Future  History of  pneumonia Overview: He reports having pneumonia this repeatedly many times throughout his life starting even at a very young age before he was a smoker.  He would like to hold off on getting pneumonia vaccine or RSV vaccine.  He has quit  Assessment & Plan: Encouraged him to get they are the vaccine and the Prevnar 20 both of which I think are great vaccines encouraged him to continue not smoking or drinking and applauded him for doing a great job quitting   Gastroesophageal reflux disease without esophagitis Assessment & Plan: I advised Mr. Derek Bullock that the omeprazole 40 mg that he has been taking to prevent reflux for the last 2 to 3 years will cause vitamin deficiencies and contribute to dementia if it is continued long-term.  I therefore recommend we cut back the dose just a little bit and if he gets any heartburn as a result of that take Pepcid Complete for the breakthrough heartburn I sent in Pepcid Complete to help with that as well as the lower dose of the heartburn pill and I put these instructions on his after visit summary to help him understand how to slowly reduce the dose because there is terrible rebound heartburn       Subjective - Clinical Presentation:   Derek Bullock is a 62 y.o. male  Patient Active Problem List   Diagnosis Date Noted   History of smoking 25-50 pack years 02/04/2023   History of pneumonia 02/04/2023   GERD (gastroesophageal reflux disease) 02/04/2023   History of colon polyps 11/24/2022   Palpitations 11/24/2022   Limping  11/24/2022   Cirrhosis of liver ([place]) 11/24/2022   Distal interphalangeal nodule 11/24/2022   Gynecomastia, male 05/11/2015   Past Medical History:  Diagnosis Date   Alcohol dependence with other alcohol-induced disorder ([place]) 11/24/2022   He initially had a protuberant abdomen with fluid shift and asterixis I informed him this is concerning for advanced liver disease but after stopping drinking all these problems  seemed to dramatically improve  He had elevated ast/alternative (alcoholic hepatitis)      Arrhythmia 12/22/2005   Arthritis    Bilateral impacted cerumen 11/24/2022   Cerumen impaction 12/31/2022   He has ringing in the ear ear and persistently impacted cerumen that did not resolve with Debrox treatment although it did soften up   GERD (gastroesophageal reflux disease)    Hemorrhoid 12/22/1998   s/p banding    Limping 11/24/2022   Keeps left leg straight when walking- says the problem is in the hip though the knee doesn't bend and there is no pain in either.   Tinnitus of right ear 11/24/2022   Associated with cerumen impaction   Tobacco abuse 11/22/2012   Smokes 1/3 ppd patient reports 11/24/22  Discontinued cly 1 week prior to 12/31/22 appointment       Outpatient Medications Prior to Visit  Medication Sig   multivitamin (ONE-A-DAY MEN'S) TABS tablet Take 1 tablet by mouth daily.   omeprazole (PRILOSEC) 40 MG capsule Take 40 mg by mouth daily.   thiamine (VITAMIN B1) 100 MG tablet Take 1 tablet (100 mg total) by mouth daily.   [DISCONTINUED] acamprosate (Derek Bullock) 333 MG tablet Take 2 tablets (666 mg total) by mouth 3 (three) times daily with meals.   [DISCONTINUED] carbamide peroxide (DEBROX) 6.5 % OTIC solution Place 5 drops into both ears 2 (two) times daily.   No facility-administered medications prior to visit.    Chief Complaint  Patient presents with   1 month follow-up    HPI  Routine follow up no specific  concerns He agreed to get LDCT for history smoking, has stopped smoking and drinking He has limp and poor balance.  History knee surgery left  Taking omeprazole 40 not getting heartburn ever anymore.         Objective:  Physical Exam  BP 112/71   Pulse 72   Temp 98.1 F (36.7 C) (Temporal)   Resp 16   Ht 6' 1"$  (1.854 m)   Wt 182 lb 12.8 oz (82.9 kg)   SpO2 99%   BMI 24.12 kg/m  Well developed, well nourished  by BMI criteria but truncal adiposity (waist  circumference or caliper) should be used instead. Wt Readings from Last 10 Encounters:  02/04/23 182 lb 12.8 oz (82.9 kg)  12/31/22 182 lb 6.4 oz (82.7 kg)  11/24/22 186 lb 6.4 oz (84.6 kg)  04/10/16 139 lb (63 kg)  04/02/16 139 lb (63 kg)  02/18/16 143 lb (64.9 kg)  06/21/15 150 lb (68 kg)  05/11/15 148 lb (67.1 kg)  05/01/15 155 lb (70.3 kg)  04/30/15 150 lb (68 kg)   Vital signs reviewed.  Nursing notes reviewed. Weight trend reviewed. General Appearance:  Well developed, well nourished male in no acute distress.   Normal work of breathing at rest Musculoskeletal: All extremities are intact.  Neurological:  Awake, alert,  No obvious focal neurological deficits or cognitive impairments Psychiatric:  Appropriate mood, pleasant demeanor Problem-specific findings:  distal interphalangeal joint nodules. Poor balance can't stand on one foot and limp.  Results Reviewed: No results found for any visits on 02/04/23.  Recent Results (from the past 2160 hour(s))  CBC w/Diff     Status: Abnormal   Collection Time: 11/24/22 10:14 AM  Result Value Ref Range   WBC 5.0 4.0 - 10.5 K/uL   RBC 4.02 (L) 4.22 - 5.81 Mil/uL   Hemoglobin 13.8 13.0 - 17.0 g/dL   HCT 40.5 39.0 - 52.0 %   MCV 100.8 (H) 78.0 - 100.0 fl   MCHC 34.1 30.0 - 36.0 g/dL   RDW 14.0 11.5 - 15.5 %   Platelets 265.0 150.0 - 400.0 K/uL   Neutrophils Relative % 46.2 43.0 - 77.0 %   Lymphocytes Relative 35.0 12.0 - 46.0 %   Monocytes Relative 15.7 (H) 3.0 - 12.0 %   Eosinophils Relative 1.9 0.0 - 5.0 %   Basophils Relative 1.2 0.0 - 3.0 %   Neutro Abs 2.3 1.4 - 7.7 K/uL   Lymphs Abs 1.7 0.7 - 4.0 K/uL   Monocytes Absolute 0.8 0.1 - 1.0 K/uL   Eosinophils Absolute 0.1 0.0 - 0.7 K/uL   Basophils Absolute 0.1 0.0 - 0.1 K/uL  Comp Met (CMET)     Status: Abnormal   Collection Time: 11/24/22 10:14 AM  Result Value Ref Range   Sodium 134 (L) 135 - 145 mEq/L   Potassium 5.1 3.5 - 5.1 mEq/L   Chloride 98 96 - 112 mEq/L   CO2  28 19 - 32 mEq/L   Glucose, Bld 82 70 - 99 mg/dL   BUN 6 6 - 23 mg/dL   Creatinine, Ser 0.81 0.40 - 1.50 mg/dL   Total Bilirubin 0.4 0.2 - 1.2 mg/dL   Alkaline Phosphatase 81 39 - 117 U/L   AST 51 (H) 0 - 37 U/L   ALT 44 0 - 53 U/L   Total Protein 7.0 6.0 - 8.3 g/dL   Albumin 4.5 3.5 - 5.2 g/dL   GFR 94.89 >60.00 mL/min    Comment: Calculated using the CKD-EPI Creatinine Equation (2021)   Calcium 9.6 8.4 - 10.5 mg/dL  Lipid panel     Status: Abnormal   Collection Time: 11/24/22 10:14 AM  Result Value Ref Range   Cholesterol 219 (H) 0 - 200 mg/dL    Comment: ATP III Classification       Desirable:  < 200 mg/dL               Borderline High:  200 - 239 mg/dL          High:  > = 240 mg/dL   Triglycerides 48.0 0.0 - 149.0 mg/dL    Comment: Normal:  <150 mg/dLBorderline High:  150 - 199 mg/dL   HDL 113.20 >39.00 mg/dL   VLDL 9.6 0.0 - 40.0 mg/dL   LDL Cholesterol 96 0 - 99 mg/dL   Total CHOL/HDL Ratio 2     Comment:                Men          Women1/2 Average Risk     3.4          3.3Average Risk          5.0          4.42X Average Risk          9.6          7.13X Average Risk          15.0  11.0                       NonHDL 105.94     Comment: NOTE:  Non-HDL goal should be 30 mg/dL higher than patient's LDL goal (i.e. LDL goal of < 70 mg/dL, would have non-HDL goal of < 100 mg/dL)          Signed: Loralee Pacas, MD 02/04/2023 8:06 PM

## 2023-02-19 ENCOUNTER — Ambulatory Visit: Payer: Medicaid Other | Admitting: Cardiology

## 2023-02-24 ENCOUNTER — Ambulatory Visit: Payer: Medicaid Other | Admitting: Physical Therapy

## 2023-03-04 ENCOUNTER — Inpatient Hospital Stay: Admission: RE | Admit: 2023-03-04 | Payer: Medicaid Other | Source: Ambulatory Visit

## 2023-03-05 ENCOUNTER — Ambulatory Visit: Payer: Medicaid Other | Admitting: Internal Medicine

## 2023-03-06 ENCOUNTER — Ambulatory Visit (INDEPENDENT_AMBULATORY_CARE_PROVIDER_SITE_OTHER): Payer: Medicaid Other | Admitting: Internal Medicine

## 2023-03-06 ENCOUNTER — Encounter: Payer: Self-pay | Admitting: Internal Medicine

## 2023-03-06 ENCOUNTER — Other Ambulatory Visit: Payer: Self-pay

## 2023-03-06 VITALS — BP 102/70 | HR 64 | Temp 98.0°F | Ht 73.0 in | Wt 179.4 lb

## 2023-03-06 DIAGNOSIS — K219 Gastro-esophageal reflux disease without esophagitis: Secondary | ICD-10-CM

## 2023-03-06 DIAGNOSIS — R1011 Right upper quadrant pain: Secondary | ICD-10-CM

## 2023-03-06 DIAGNOSIS — Z1211 Encounter for screening for malignant neoplasm of colon: Secondary | ICD-10-CM

## 2023-03-06 DIAGNOSIS — K7031 Alcoholic cirrhosis of liver with ascites: Secondary | ICD-10-CM

## 2023-03-06 HISTORY — DX: Right upper quadrant pain: R10.11

## 2023-03-06 LAB — CBC
HCT: 37.2 % — ABNORMAL LOW (ref 39.0–52.0)
Hemoglobin: 12.9 g/dL — ABNORMAL LOW (ref 13.0–17.0)
MCHC: 34.6 g/dL (ref 30.0–36.0)
MCV: 94.7 fl (ref 78.0–100.0)
Platelets: 326 10*3/uL (ref 150.0–400.0)
RBC: 3.93 Mil/uL — ABNORMAL LOW (ref 4.22–5.81)
RDW: 13.7 % (ref 11.5–15.5)
WBC: 6 10*3/uL (ref 4.0–10.5)

## 2023-03-06 LAB — COMPREHENSIVE METABOLIC PANEL
ALT: 13 U/L (ref 0–53)
AST: 15 U/L (ref 0–37)
Albumin: 3.9 g/dL (ref 3.5–5.2)
Alkaline Phosphatase: 70 U/L (ref 39–117)
BUN: 11 mg/dL (ref 6–23)
CO2: 29 mEq/L (ref 19–32)
Calcium: 9.6 mg/dL (ref 8.4–10.5)
Chloride: 107 mEq/L (ref 96–112)
Creatinine, Ser: 0.85 mg/dL (ref 0.40–1.50)
GFR: 93.33 mL/min (ref >60.00)
Glucose, Bld: 90 mg/dL (ref 70–99)
Potassium: 4.3 mEq/L (ref 3.5–5.1)
Sodium: 141 mEq/L (ref 135–145)
Total Bilirubin: 0.5 mg/dL (ref 0.2–1.2)
Total Protein: 6.3 g/dL (ref 6.0–8.3)

## 2023-03-06 LAB — LIPASE: Lipase: 15 U/L (ref 11.0–59.0)

## 2023-03-06 LAB — AMYLASE: Amylase: 31 U/L (ref 27–131)

## 2023-03-06 MED ORDER — OMEPRAZOLE 20 MG PO CPDR
DELAYED_RELEASE_CAPSULE | ORAL | 3 refills | Status: DC
  Filled 2023-03-06: qty 30, 30d supply, fill #0

## 2023-03-06 NOTE — Progress Notes (Signed)
Derek Bullock [facility]: [id number]   Routine Medical Office Visit  Patient:  Derek Bullock      Age: 62 y.o.       Sex:  male  Date:   03/06/2023  PCP:    Loralee Pacas, [place] Provider: Loralee Pacas, MD   Assessment and Plan:   Derek Bullock was seen today for 1 month follow-up.  RUQ pain -     Urinalysis, Complete -     Comprehensive metabolic panel -     CBC -     Amylase -     Lipase -     US Abdomen Complete; Future  Colon cancer screening -     Cologuard  Gastroesophageal reflux disease without esophagitis -     Omeprazole; Take 1 capsule (20 mg total) by mouth daily for 90 days, THEN 1 capsule (20 mg total) every other day. Replaces 40 mg dose.  Tapering off.  Use pepcid complete for heartburn flares as you taper and after tapering completed.  Dispense: 30 capsule; Refill: 3 -     CBC -     US Abdomen Complete; Future -     Ambulatory referral to Gastroenterology  Alcoholic cirrhosis of liver with ascites ([place]) Overview: Presumed alcohol associated Speculative diagnosis based on stigmata (gynecomastia, asterixis, protuberant abdomen presumed ascitic)  Orders: -     Comprehensive metabolic panel -     AFP tumor marker -     US Abdomen Complete; Future -     Ambulatory referral to Gastroenterology    Referred care to gastrointestinal again, he says he never heard back re: colonoscopy for polyps.  I want them to also evaluate liver cirrhosis so placed under new diagnosis link.  He will go ahead and get Cologuard in meantime.  I want to taper off omeprazole still.... but it might not be wise due to risk of variceal bleeding.  Would like gastroenterology input on that and on beta blocker    Clinical Presentation:   The patient is a 62 y.o. male: Active Ambulatory Problems    Diagnosis Date Noted   Gynecomastia, male 05/11/2015   History of colon polyps 11/24/2022   Palpitations 11/24/2022   Limping 11/24/2022    Cirrhosis of liver ([place]) 11/24/2022   Distal interphalangeal nodule 11/24/2022   History of smoking 25-50 pack years 02/04/2023   History of pneumonia 02/04/2023   GERD (gastroesophageal reflux disease) 02/04/2023   Hyperlipidemia, acquired 02/04/2023   RUQ pain 03/06/2023   Resolved Ambulatory Problems    Diagnosis Date Noted   Rectal bleeding 11/22/2012   ETOH abuse 11/22/2012   Tobacco abuse 11/22/2012   Screening for HIV (human immunodeficiency virus) 05/11/2015   Hemorrhoid    Healthcare maintenance 06/12/2015   Alcohol dependence with other alcohol-induced disorder ([place]) 11/24/2022   Tinnitus of right ear 11/24/2022   Bilateral impacted cerumen 11/24/2022   Cerumen impaction 12/31/2022   Past Medical History:  Diagnosis Date   Arrhythmia 12/22/2005   Arthritis     Outpatient Medications Prior to Visit  Medication Sig   famotidine-calcium carbonate-magnesium hydroxide (PEPCID COMPLETE) 10-800-165 MG chewable tablet Chew 1 tablet by mouth daily as needed (take these only as needed for heartburn acid symptoms).   Multiple Vitamins-Minerals (ONE DAILY FOR MEN 50+ ADVANCED) TABS Take 1 tablet by mouth daily.   multivitamin (ONE-A-DAY MEN'S) TABS tablet Take 1 tablet by mouth daily.   thiamine (VITAMIN B1) 100 MG tablet Take 1  tablet (100 mg total) by mouth daily.   [DISCONTINUED] omeprazole (PRILOSEC) 20 MG capsule Take 1 capsule (20 mg total) by mouth daily. Replaces 40 mg dose, take every day   cyanocobalamin (VITAMIN B12) 1000 MCG/ML injection Inject into the muscle. (Patient not taking: Reported on 03/06/2023)   omeprazole (PRILOSEC) 40 MG capsule Take 40 mg by mouth daily. (Patient not taking: Reported on 03/06/2023)   No facility-administered medications prior to visit.     Chief Complaint  Patient presents with   1 month follow-up    HPI  Still waiting on LDCT Plans Cologuard Hasn't got gastrointestinal referral- ? If right upper quadrant of abdomen pain related  with cirrhosis, patient reports not drinking alcohol or smoking at all anymore          Clinical Data Analysis:  Physical Exam  BP 102/70 (BP Location: Left Arm, Patient Position: Sitting)   Pulse 64   Temp 98 F (36.7 C) (Temporal)   Ht 6\' 1"  (1.854 m)   Wt 179 lb 6.4 oz (81.4 kg)   SpO2 96%   BMI 23.67 kg/m  Wt Readings from Last 10 Encounters:  03/06/23 179 lb 6.4 oz (81.4 kg)  02/04/23 182 lb 12.8 oz (82.9 kg)  12/31/22 182 lb 6.4 oz (82.7 kg)  11/24/22 186 lb 6.4 oz (84.6 kg)  04/10/16 139 lb (63 kg)  04/02/16 139 lb (63 kg)  02/18/16 143 lb (64.9 kg)  06/21/15 150 lb (68 kg)  05/11/15 148 lb (67.1 kg)  05/01/15 155 lb (70.3 kg)   Vital signs reviewed.  Nursing notes reviewed. Weight trend reviewed. Abnormalities noted: none BMI is an unreliable indicator of healthy body composition due to its inability to reflect lean muscle mass.  General Appearance:  Well developed, well nourished male in no acute distress.   Pulmonary:  Normal work of breathing at rest, no respiratory distress apparent. SpO2: 96 %  Musculoskeletal: All extremities are intact.  Neurological:  Awake, alert. No obvious focal neurological deficits or cognitive impairments.  Sensorium seems unclouded. Psychiatric:  Appropriate mood, pleasant demeanor Problem-specific findings:  abdomen soft nontender, nondistended    Results Reviewed: (reviewed labs/imaging may be also be found in the assessment / plan section):      Latest Reference Range & Units 02/18/16 10:00 04/10/16 00:00 04/10/16 11:23 11/27/21 10:16 11/24/22 10:14  COMPREHENSIVE METABOLIC PANEL      Rpt !  Sodium 135 - 145 mEq/L     134 (L)  Potassium 3.5 - 5.1 mEq/L     5.1  Chloride 96 - 112 mEq/L     98  CO2 19 - 32 mEq/L     28  Glucose 70 - 99 mg/dL     82  BUN 6 - 23 mg/dL     6  Creatinine 0.40 - 1.50 mg/dL     0.81  Calcium 8.4 - 10.5 mg/dL     9.6  Alkaline Phosphatase 39 - 117 U/L     81  Albumin 3.5 - 5.2 g/dL     4.5  AST  0 - 37 U/L     51 (H)  ALT 0 - 53 U/L     44  Total Protein 6.0 - 8.3 g/dL     7.0  Total Bilirubin 0.2 - 1.2 mg/dL     0.4  GFR >60.00 mL/min     94.89  Total CHOL/HDL Ratio      2  Cholesterol 0 - 200 mg/dL  219 (H)  HDL Cholesterol >39.00 mg/dL     113.20  LDL (calc) 0 - 99 mg/dL     96  NonHDL      105.94  Triglycerides 0.0 - 149.0 mg/dL     48.0  VLDL 0.0 - 40.0 mg/dL     9.6  WBC 4.0 - 10.5 K/uL     5.0  RBC 4.22 - 5.81 Mil/uL     4.02 (L)  Hemoglobin 13.0 - 17.0 g/dL     13.8  HCT 39.0 - 52.0 %     40.5  MCV 78.0 - 100.0 fl     100.8 (H)  MCHC 30.0 - 36.0 g/dL     34.1  RDW 11.5 - 15.5 %     14.0  Platelets 150.0 - 400.0 K/uL     265.0  Neutrophils 43.0 - 77.0 %     46.2  Lymphocytes 12.0 - 46.0 %     35.0  Monocytes Relative 3.0 - 12.0 %     15.7 (H)  Eosinophil 0.0 - 5.0 %     1.9  Basophil 0.0 - 3.0 %     1.2  NEUT# 1.4 - 7.7 K/uL     2.3  Lymphocyte # 0.7 - 4.0 K/uL     1.7  Monocyte # 0.1 - 1.0 K/uL     0.8  Eosinophils Absolute 0.0 - 0.7 K/uL     0.1  Basophils Absolute 0.0 - 0.1 K/uL     0.1  Hemoglobin A1C  4.9      COLONOSCOPY    Rpt    DG CHEST 2 VIEW     Rpt   SURGICAL PATHOLOGY   Rpt     Polyps on old colonoscopy, negative cxr      Signed: Loralee Pacas, MD 03/06/2023 12:59 PM

## 2023-03-07 LAB — URINALYSIS, COMPLETE
Bacteria, UA: NONE SEEN /HPF
Bilirubin Urine: NEGATIVE
Glucose, UA: NEGATIVE
Hgb urine dipstick: NEGATIVE
Hyaline Cast: NONE SEEN /LPF
Ketones, ur: NEGATIVE
Leukocytes,Ua: NEGATIVE
Nitrite: NEGATIVE
Protein, ur: NEGATIVE
RBC / HPF: NONE SEEN /HPF (ref 0–2)
Specific Gravity, Urine: 1.015 (ref 1.001–1.035)
WBC, UA: NONE SEEN /HPF (ref 0–5)
pH: 6.5 (ref 5.0–8.0)

## 2023-03-07 NOTE — Progress Notes (Signed)
As predicted, labs are much better since stopping drinking  MCV normalized and so did liver function tests.   Mild anemia is noted incidnetally we should evaluate again on next blood draw- for now just monitor stool for blood, consider fecal occult blood testing (test for traces of blood in bowel movments))  Please place any orders needed / requested. Arrange/confirm follow up appt(s).

## 2023-03-10 ENCOUNTER — Other Ambulatory Visit: Payer: Self-pay

## 2023-03-10 DIAGNOSIS — D649 Anemia, unspecified: Secondary | ICD-10-CM

## 2023-03-12 ENCOUNTER — Other Ambulatory Visit: Payer: Self-pay

## 2023-03-17 ENCOUNTER — Ambulatory Visit: Payer: Medicaid Other | Admitting: Physical Therapy

## 2023-03-18 ENCOUNTER — Ambulatory Visit: Payer: Medicaid Other | Admitting: Cardiology

## 2023-03-27 ENCOUNTER — Encounter: Payer: Self-pay | Admitting: Internal Medicine

## 2023-03-27 ENCOUNTER — Ambulatory Visit (INDEPENDENT_AMBULATORY_CARE_PROVIDER_SITE_OTHER): Payer: Medicaid Other | Admitting: Internal Medicine

## 2023-03-27 VITALS — BP 102/64 | HR 60 | Temp 98.3°F | Ht 73.0 in | Wt 175.2 lb

## 2023-03-27 DIAGNOSIS — R1011 Right upper quadrant pain: Secondary | ICD-10-CM | POA: Diagnosis not present

## 2023-03-27 DIAGNOSIS — R2689 Other abnormalities of gait and mobility: Secondary | ICD-10-CM | POA: Diagnosis not present

## 2023-03-27 DIAGNOSIS — M151 Heberden's nodes (with arthropathy): Secondary | ICD-10-CM

## 2023-03-27 DIAGNOSIS — E785 Hyperlipidemia, unspecified: Secondary | ICD-10-CM

## 2023-03-27 DIAGNOSIS — Z8701 Personal history of pneumonia (recurrent): Secondary | ICD-10-CM

## 2023-03-27 DIAGNOSIS — Z87891 Personal history of nicotine dependence: Secondary | ICD-10-CM

## 2023-03-27 DIAGNOSIS — R002 Palpitations: Secondary | ICD-10-CM | POA: Diagnosis not present

## 2023-03-27 DIAGNOSIS — R109 Unspecified abdominal pain: Secondary | ICD-10-CM | POA: Diagnosis not present

## 2023-03-27 DIAGNOSIS — K219 Gastro-esophageal reflux disease without esophagitis: Secondary | ICD-10-CM

## 2023-03-27 DIAGNOSIS — N62 Hypertrophy of breast: Secondary | ICD-10-CM

## 2023-03-27 DIAGNOSIS — Z86010 Personal history of colonic polyps: Secondary | ICD-10-CM

## 2023-03-27 DIAGNOSIS — D649 Anemia, unspecified: Secondary | ICD-10-CM

## 2023-03-27 NOTE — Assessment & Plan Note (Addendum)
Referral pending cardiology, appointment made, encouraged completing as planned

## 2023-03-27 NOTE — Progress Notes (Signed)
[facility]: [phone]   Routine Medical Office Visit  Patient:  Derek Bullock      Age: 62 y.o.       Sex:  male  Date:   03/28/2023  PCP:    Lula OlszewskiMorrison, Derald Lorge G, MD   Today's Healthcare Provider: Lula Olszewskiyan G Clyde Upshaw, MD   Assessment and Plan:   Limping Assessment & Plan: Physical therapy planned, encouraged completing as planned    RUQ pain Assessment & Plan: Already scheduled for ultrasound Also CT lung Suspicious for kidney stone or liver/gallbladder problem   Orders: -     Urinalysis, Complete -     DG Abd 1 View; Future  Flank pain -     Urinalysis, Complete -     DG Abd 1 View; Future  Palpitations Assessment & Plan: Referral pending cardiology, appointment made, encouraged completing as planned    Hyperlipidemia, acquired Assessment & Plan: Tc elevated but good HDL.Marland Kitchen.. no need for medication(s)    History of smoking 25-50 pack years  History of pneumonia Assessment & Plan: Encouraged Mr.Dralle  to do pneumonia shots but patient defers "I hate those things that make you feel sick"   History of colon polyps Assessment & Plan: Encouraged continuing with plan to do repeat colonoscopy      Gynecomastia, male Assessment & Plan: Encouraged continuing with alcohol abstinence   Gastroesophageal reflux disease without esophagitis Assessment & Plan: Initially I recommended taper off then retracted the plan to taper off proton pump inhibitor (PPI) stomach acid reducer taper off due to concern(s) possible esophageal varices  Encouraged Mr.Celia  to stay on the 20 mg until he sees gastrointestinal due to liver issue want to rule out varices before stopping it.   Anemia, unspecified type  Distal interphalangeal nodule    Future Appointments  Date Time Provider Department Center  03/31/2023  3:40 PM GI-315 CT 1 GI-315CT GI-315 W. WE  04/01/2023 11:30 AM Fredderick PhenixReinhartsen, Karl E, [facility]  04/06/2023  8:00 AM GI-315 US 2  GI-315US1 GI-315 W. WE  04/23/2023  1:40 PM Little IshikawaSchumann, Christopher L, MD [facility] None  06/23/2023 10:30 AM Pyrtle, Carie CaddyJay M, MD [facility]  07/27/2023  9:00 AM Lula OlszewskiMorrison, Derrick Tiegs G, MD [facility]       Clinical Presentation:   62 y.o. male here today for 3 week follow-up  Reviewed:  has a past medical history of Alcohol dependence with other alcohol-induced disorder (11/24/2022), Arrhythmia (12/22/2005), Arthritis, Bilateral impacted cerumen (11/24/2022), Cerumen impaction (12/31/2022), GERD (gastroesophageal reflux disease), Hemorrhoid (12/22/1998), Limping (11/24/2022), Tinnitus of right ear (11/24/2022), and Tobacco abuse (11/22/2012). Active Ambulatory Problems    Diagnosis Date Noted   Gynecomastia, male 05/11/2015   History of colon polyps 11/24/2022   Palpitations 11/24/2022   Limping 11/24/2022   Cirrhosis of liver 11/24/2022   Distal interphalangeal nodule 11/24/2022   History of smoking 25-50 pack years 02/04/2023   History of pneumonia 02/04/2023   GERD (gastroesophageal reflux disease) 02/04/2023   Hyperlipidemia, acquired 02/04/2023   RUQ pain 03/06/2023   Flank pain 03/27/2023   Anemia 03/27/2023   Resolved Ambulatory Problems    Diagnosis Date Noted   Rectal bleeding 11/22/2012   ETOH abuse 11/22/2012   Tobacco abuse 11/22/2012   Screening for HIV (human immunodeficiency virus) 05/11/2015   Hemorrhoid    Healthcare maintenance 06/12/2015   Alcohol dependence with other alcohol-induced disorder 11/24/2022   Tinnitus of right ear 11/24/2022   Bilateral impacted cerumen 11/24/2022   Cerumen impaction 12/31/2022  Past Medical History:  Diagnosis Date   Arrhythmia 12/22/2005   Arthritis     Outpatient Medications Prior to Visit  Medication Sig   cyanocobalamin (VITAMIN B12) 1000 MCG/ML injection Inject into the muscle.   famotidine-calcium carbonate-magnesium hydroxide (PEPCID COMPLETE) 10-800-165 MG chewable tablet Chew 1 tablet by mouth daily as needed  (take these only as needed for heartburn acid symptoms).   Multiple Vitamins-Minerals (ONE DAILY FOR MEN 50+ ADVANCED) TABS Take 1 tablet by mouth daily.   multivitamin (ONE-A-DAY MEN'S) TABS tablet Take 1 tablet by mouth daily.   omeprazole (PRILOSEC) 20 MG capsule Take 1 capsule (20 mg total) by mouth daily for 90 days, THEN 1 capsule (20 mg total) every other day. Replaces 40 mg dose.  Tapering off.  Use pepcid complete for heartburn flares as you taper and after tapering completed.   thiamine (VITAMIN B1) 100 MG tablet Take 1 tablet (100 mg total) by mouth daily.   [DISCONTINUED] omeprazole (PRILOSEC) 40 MG capsule Take 40 mg by mouth daily.   No facility-administered medications prior to visit.    HPI  Updated and modified:  Problem  Ruq Pain   Has been improving  Does wrap around to kidney area Came and gone off/on since 2022 He reports it does NOT associated with constipation, diarrhea, nausea or any other symptom(s)    History of Smoking 25-50 Pack Years   Interim history- still smoking one here and there, plans to do LDCT, encouraged completing as planned    History of Pneumonia    Prior history: Had pneumonia this repeatedly many times throughout his life starting even at a very young age before he was a smoker.   Quit smoking Defers and has not completed any recommended vaccine(s)    Gerd (Gastroesophageal Reflux Disease)   Interim history: has reduced to 20 mg omeprazole as per my instructions.... At his last visit,  "I advised Mr. Kingsley PlanHonecker that the omeprazole 40 mg that he has been taking to prevent reflux for the last 2 to 3 years will cause vitamin deficiencies and contribute to dementia if it is continued long-term. I therefore recommend we cut back the dose just a little bit and if he gets any heartburn as a result of that take Pepcid Complete for the breakthrough heartburn I sent in Pepcid Complete to help with that as well as the lower dose of the heartburn pill and I  put these instructions on his after visit summary to help him understand how to slowly reduce the dose because there is terrible rebound heartburn "   Hyperlipidemia, Acquired    Prior history: Tc elevated but good HDL.Marland Kitchen.. no need for medication(s) Statin myopathy history: never took statin Current Regimen: Diet and exercise - reports eating heart healthy diet  Lab Results  Component Value Date   CHOL 219 (H) 11/24/2022   Lab Results  Component Value Date   HDL 113.20 11/24/2022   Lab Results  Component Value Date   LDLCALC 96 11/24/2022   Lab Results  Component Value Date   TRIG 48.0 11/24/2022   Lab Results  Component Value Date   CHOLHDL 2 11/24/2022  No results found for: "LDLDIRECT"  The ASCVD Risk score (Arnett DK, et al., 2019) failed to calculate for the following reasons:   The valid HDL cholesterol range is 20 to 100 mg/dL    History of Colon Polyps   Plans to see gastrointestinal soon for repeat.  Path from 2017 colonoscopy 1. Surgical [P], ascending  and cecum, polyps(3) - TUBULAR ADENOMAS. - NO HIGH GRADE DYSPLASIA OR MALIGNANCY IDENTIFIED. 2. Surgical [P], transverse, polyps(2) - TUBULAR ADENOMA AND HYPERPLASTIC COLONIC POLYP. - NO HIGH GRADE DYSPLASIA OR MALIGNANCY IDENTIFIED. 3. Surgical [P], rectum, sigmoid and descending, recto sigmoid, polyps(5) - TUBULAR ADENOMA AND HYPERPLASTIC COLONIC POLYPS. - NO HIGH GRADE DYSPLASIA OR MALIGNANCY IDENTIFIED.   Palpitations   Interim history these haven't been bothering him much lately  Prior history :  has intermittent palpitations and 2012 evaluation by cardiologist Hurman Horn documented the tachybradycardia and possibly doing loop recorder versus seeing electrophysiology but he has not followed up in about 12 yea   Limping   Interim history: plans to see rehab soon, taking B12 as advised  Keeps left leg straight when walking- says the problem is in the hip though the knee doesn't bend and there is no  pain in either.   Distal Interphalangeal Nodule   [place] nodes small on hands noted Painted all his life Arthritis hurts every now and then   Gynecomastia, Male   Suspicious liver disease induced Has discontinued all alcohol at beginning of 2024            Clinical Data Analysis:   Physical Exam  BP 102/64 (BP Location: Left Arm, Patient Position: Sitting)   Pulse 60   Temp 98.3 F (36.8 C) (Temporal)   Ht 6\' 1"  (1.854 m)   Wt 175 lb 3.2 oz (79.5 kg)   SpO2 92%   BMI 23.11 kg/m  Wt Readings from Last 10 Encounters:  03/27/23 175 lb 3.2 oz (79.5 kg)  03/06/23 179 lb 6.4 oz (81.4 kg)  02/04/23 182 lb 12.8 oz (82.9 kg)  12/31/22 182 lb 6.4 oz (82.7 kg)  11/24/22 186 lb 6.4 oz (84.6 kg)  04/10/16 139 lb (63 kg)  04/02/16 139 lb (63 kg)  02/18/16 143 lb (64.9 kg)  06/21/15 150 lb (68 kg)  05/11/15 148 lb (67.1 kg)   Vital signs reviewed.  Nursing notes reviewed. Weight trend reviewed. Abnormalities and Problem-Specific physical exam findings:  looks great General Appearance:  No acute distress appreciable.   Well-groomed, healthy-appearing male.  Well proportioned with no abnormal fat distribution.  Good muscle tone. Skin: Clear and well-hydrated. Pulmonary:  Normal work of breathing at rest, no respiratory distress apparent. SpO2: 92 %  Musculoskeletal: Patient demonstrates smooth and coordinated movements throughout all major joints.All extremities are intact.  Neurological:  Awake, alert, oriented, and engaged.  No obvious focal neurological deficits or cognitive impairments.  Sensorium seems unclouded. Gait is smooth and coordinated.  Speech is clear and coherent with logical content. Psychiatric:  Appropriate mood, pleasant and cooperative demeanor, cheerful and engaged during the exam    Additional Results Reviewed:     Results for orders placed or performed in visit on 03/27/23  Urinalysis, Complete  Result Value Ref Range   Color, Urine DARK YELLOW YELLOW    APPearance CLEAR CLEAR   Specific Gravity, Urine 1.018 1.001 - 1.035   pH 7.5 5.0 - 8.0   Glucose, UA NEGATIVE NEGATIVE   Bilirubin Urine NEGATIVE NEGATIVE   Ketones, ur NEGATIVE NEGATIVE   Hgb urine dipstick NEGATIVE NEGATIVE   Protein, ur NEGATIVE NEGATIVE   Nitrite NEGATIVE NEGATIVE   Leukocytes,Ua NEGATIVE NEGATIVE   WBC, UA 6-10 (A) 0 - 5 /HPF   RBC / HPF NONE SEEN 0 - 2 /HPF   Squamous Epithelial / HPF 0-5 < OR = 5 /HPF   Bacteria, UA NONE SEEN  NONE SEEN /HPF   Hyaline Cast NONE SEEN NONE SEEN /LPF   Note      Recent Results (from the past 2160 hour(s))  Comp Met (CMET)     Status: None   Collection Time: 03/06/23 10:15 AM  Result Value Ref Range   Sodium 141 135 - 145 mEq/L   Potassium 4.3 3.5 - 5.1 mEq/L   Chloride 107 96 - 112 mEq/L   CO2 29 19 - 32 mEq/L   Glucose, Bld 90 70 - 99 mg/dL   BUN 11 6 - 23 mg/dL   Creatinine, Ser 1.61 0.40 - 1.50 mg/dL   Total Bilirubin 0.5 0.2 - 1.2 mg/dL   Alkaline Phosphatase 70 39 - 117 U/L   AST 15 0 - 37 U/L   ALT 13 0 - 53 U/L   Total Protein 6.3 6.0 - 8.3 g/dL   Albumin 3.9 3.5 - 5.2 g/dL   GFR 09.60 >45.40 mL/min    Comment: Calculated using the CKD-EPI Creatinine Equation (2021)   Calcium 9.6 8.4 - 10.5 mg/dL  CBC     Status: Abnormal   Collection Time: 03/06/23 10:15 AM  Result Value Ref Range   WBC 6.0 4.0 - 10.5 K/uL   RBC 3.93 (L) 4.22 - 5.81 Mil/uL   Platelets 326.0 150.0 - 400.0 K/uL   Hemoglobin 12.9 (L) 13.0 - 17.0 g/dL   HCT 98.1 (L) 19.1 - 47.8 %   MCV 94.7 78.0 - 100.0 fl   MCHC 34.6 30.0 - 36.0 g/dL   RDW 29.5 62.1 - 30.8 %  Amylase     Status: None   Collection Time: 03/06/23 10:15 AM  Result Value Ref Range   Amylase 31 27 - 131 U/L  Lipase     Status: None   Collection Time: 03/06/23 10:15 AM  Result Value Ref Range   Lipase 15.0 11.0 - 59.0 U/L  Urinalysis, Complete     Status: None   Collection Time: 03/06/23 10:20 AM  Result Value Ref Range   Color, Urine YELLOW YELLOW   APPearance CLEAR  CLEAR   Specific Gravity, Urine 1.015 1.001 - 1.035   pH 6.5 5.0 - 8.0   Glucose, UA NEGATIVE NEGATIVE   Bilirubin Urine NEGATIVE NEGATIVE   Ketones, ur NEGATIVE NEGATIVE   Hgb urine dipstick NEGATIVE NEGATIVE   Protein, ur NEGATIVE NEGATIVE   Nitrite NEGATIVE NEGATIVE   Leukocytes,Ua NEGATIVE NEGATIVE   WBC, UA NONE SEEN 0 - 5 /HPF   RBC / HPF NONE SEEN 0 - 2 /HPF   Squamous Epithelial / HPF 0-5 < OR = 5 /HPF   Bacteria, UA NONE SEEN NONE SEEN /HPF   Hyaline Cast NONE SEEN NONE SEEN /LPF   Note      Comment: This urine was analyzed for the presence of WBC,  RBC, bacteria, casts, and other formed elements.  Only those elements seen were reported. . .   Urinalysis, Complete     Status: Abnormal   Collection Time: 03/27/23 10:25 AM  Result Value Ref Range   Color, Urine DARK YELLOW YELLOW   APPearance CLEAR CLEAR   Specific Gravity, Urine 1.018 1.001 - 1.035   pH 7.5 5.0 - 8.0   Glucose, UA NEGATIVE NEGATIVE   Bilirubin Urine NEGATIVE NEGATIVE   Ketones, ur NEGATIVE NEGATIVE   Hgb urine dipstick NEGATIVE NEGATIVE   Protein, ur NEGATIVE NEGATIVE   Nitrite NEGATIVE NEGATIVE   Leukocytes,Ua NEGATIVE NEGATIVE   WBC, UA 6-10 (A) 0 -  5 /HPF   RBC / HPF NONE SEEN 0 - 2 /HPF   Squamous Epithelial / HPF 0-5 < OR = 5 /HPF   Bacteria, UA NONE SEEN NONE SEEN /HPF   Hyaline Cast NONE SEEN NONE SEEN /LPF   Note      Comment: This urine was analyzed for the presence of WBC,  RBC, bacteria, casts, and other formed elements.  Only those elements seen were reported. . .     No image results found.   No results found.   --------------------------------    Signed: Lula Olszewski, MD 03/28/2023 1:42 PM

## 2023-03-27 NOTE — Assessment & Plan Note (Signed)
Encouraged patient to do pneumonia shots but patient defers "I hate those things that make you feel sick"

## 2023-03-27 NOTE — Patient Instructions (Addendum)
It was a pleasure seeing you today!  Your health and satisfaction are my top priorities. If you believe your experience today was worthy of a 5-star rating, I'd be grateful for your feedback! Derek Olszewskiyan G Jacob Chamblee, MD   CHECKOUT CHECKLIST  []    Schedule next appointment(s):    Return in about 4 months (around 07/27/2023) for chronic disease monitoring and management.  Any requested lab visits should be scheduled as appointments too  If you are not doing well:  Return to the office sooner Please bring all your medicine bottles to each appointment If your condition begins to worsen or become severe:  go to the emergency room or even call 911  []     Future Appointments  Date Time Provider Department Center  03/31/2023  3:40 PM GI-315 CT 1 GI-315CT GI-315 W. WE  04/01/2023 11:30 AM Fredderick PhenixReinhartsen, Karl E, PT [facility]  04/06/2023  8:00 AM GI-315 US 2 GI-315US1 GI-315 W. WE  04/23/2023  1:40 PM Little IshikawaSchumann, Christopher L, MD [facility] None  06/23/2023 10:30 AM Pyrtle, Carie CaddyJay M, MD [facility]     []    X-rays can be obtained at the  [facility] office. You can walk in M-F between 8:30am- noon or 1pm - 5pm. Tell them you are there for xrays ordered by me. They will send me the results, then I will let you know the results with instructions. Address: [address]  The Xray department is located in the basement.     []   (Optional):  Review your clinical notes on MyChart after they are completed.     Today's draft of the physician documented plan for today's visit: (final revisions will be visible on MyChart chart later) Limping  RUQ pain Assessment & Plan: Already scheduled for ultrasound Also CT lung Suspicious for kidney stone or liver/gallbladder problem    Flank pain  Palpitations Assessment & Plan: Referral pending cardiology, appointment made      Hyperlipidemia, acquired  History of smoking 25-50 pack years  History of pneumonia Assessment & Plan: Encouraged  patient to do pneumonia shots but patient defers "I hate those things that make you feel sick"   History of colon polyps  Gynecomastia, male  Gastroesophageal reflux disease without esophagitis Assessment & Plan: Encouraged patient to stay on the 20 until he sees gastrointestinal due to liver issue want to rule out varices before stopping it.   Anemia, unspecified type    QUESTIONS & CONCERNS: CLINICAL: please contact us via phone [phone] OR MyChart messaging  LAB & IMAGING:   We will call you if the results are significantly abnormal or you don't use MyChart.  Most normal results will be posted to MyChart immediately and have a clinical review message by Dr. Jon BillingsMorrison posted within 2-3 business days.   If you have not heard from us regarding the results in 2 weeks OR if you need priority reporting, please contact this office. MYCHART:  The fastest way to get your results and easiest way to stay in touch with us is by activating your My Chart account. Instructions are located on the last page of this paperwork.  BILLING: xray and lab orders are billed from separate companies and questions./concerns should be directed to the invoicing company.  For visit charges please discuss with our administrative services COMPLAINTS:  please let Dr. Jon BillingsMorrison know or see the [facility] Practice Administrator - Burnett KanarisJeanette Pitts, by asking at the front desk: we want you to be satisfied  with every experience and we would be grateful for the opportunity to address any problems

## 2023-03-27 NOTE — Assessment & Plan Note (Signed)
Initially I recommended taper off then retracted the plan to taper off proton pump inhibitor (PPI) stomach acid reducer taper off due to concern(s) possible esophageal varices  Encouraged Derek Bullock  to stay on the 20 mg until he sees gastrointestinal due to liver issue want to rule out varices before stopping it.

## 2023-03-27 NOTE — Assessment & Plan Note (Signed)
Already scheduled for ultrasound Also CT lung Suspicious for kidney stone or liver/gallbladder problem

## 2023-03-28 LAB — URINALYSIS, COMPLETE
Bacteria, UA: NONE SEEN /HPF
Bilirubin Urine: NEGATIVE
Glucose, UA: NEGATIVE
Hgb urine dipstick: NEGATIVE
Hyaline Cast: NONE SEEN /LPF
Ketones, ur: NEGATIVE
Leukocytes,Ua: NEGATIVE
Nitrite: NEGATIVE
Protein, ur: NEGATIVE
RBC / HPF: NONE SEEN /HPF (ref 0–2)
Specific Gravity, Urine: 1.018 (ref 1.001–1.035)
pH: 7.5 (ref 5.0–8.0)

## 2023-03-28 NOTE — Assessment & Plan Note (Signed)
Physical therapy planned, encouraged completing as planned

## 2023-03-28 NOTE — Progress Notes (Signed)
I think the few white blood cell(s) are insignificant Recommend recheck next time he comes to office.

## 2023-03-28 NOTE — Assessment & Plan Note (Signed)
Advised patient to limit hand overuse, NSAIDs as needed.

## 2023-03-28 NOTE — Assessment & Plan Note (Signed)
Encouraged continuing with plan to do repeat colonoscopy

## 2023-03-28 NOTE — Assessment & Plan Note (Signed)
Tc elevated but good HDL.Marland Kitchen. no need for medication(s)

## 2023-03-28 NOTE — Assessment & Plan Note (Signed)
Encouraged continuing with alcohol abstinence

## 2023-03-31 ENCOUNTER — Inpatient Hospital Stay: Admission: RE | Admit: 2023-03-31 | Payer: Medicaid Other | Source: Ambulatory Visit

## 2023-04-01 ENCOUNTER — Ambulatory Visit: Payer: Medicaid Other | Admitting: Physical Therapy

## 2023-04-06 ENCOUNTER — Other Ambulatory Visit: Payer: Medicaid Other

## 2023-04-08 ENCOUNTER — Other Ambulatory Visit (INDEPENDENT_AMBULATORY_CARE_PROVIDER_SITE_OTHER): Payer: Medicaid Other

## 2023-04-08 DIAGNOSIS — D649 Anemia, unspecified: Secondary | ICD-10-CM | POA: Diagnosis not present

## 2023-04-08 DIAGNOSIS — R82998 Other abnormal findings in urine: Secondary | ICD-10-CM | POA: Diagnosis not present

## 2023-04-08 LAB — CBC WITH DIFFERENTIAL/PLATELET
Basophils Absolute: 0 10*3/uL (ref 0.0–0.1)
Basophils Relative: 0.8 % (ref 0.0–3.0)
Eosinophils Absolute: 0.2 10*3/uL (ref 0.0–0.7)
Eosinophils Relative: 2.4 % (ref 0.0–5.0)
HCT: 35.4 % — ABNORMAL LOW (ref 39.0–52.0)
Hemoglobin: 12.2 g/dL — ABNORMAL LOW (ref 13.0–17.0)
Lymphocytes Relative: 37.9 % (ref 12.0–46.0)
Lymphs Abs: 2.4 10*3/uL (ref 0.7–4.0)
MCHC: 34.6 g/dL (ref 30.0–36.0)
MCV: 94.6 fl (ref 78.0–100.0)
Monocytes Absolute: 0.6 10*3/uL (ref 0.1–1.0)
Monocytes Relative: 10.1 % (ref 3.0–12.0)
Neutro Abs: 3.1 10*3/uL (ref 1.4–7.7)
Neutrophils Relative %: 48.8 % (ref 43.0–77.0)
Platelets: 325 10*3/uL (ref 150.0–400.0)
RBC: 3.74 Mil/uL — ABNORMAL LOW (ref 4.22–5.81)
RDW: 14.1 % (ref 11.5–15.5)
WBC: 6.3 10*3/uL (ref 4.0–10.5)

## 2023-04-08 LAB — URINALYSIS, ROUTINE W REFLEX MICROSCOPIC
Bilirubin Urine: NEGATIVE
Hgb urine dipstick: NEGATIVE
Ketones, ur: NEGATIVE
Nitrite: NEGATIVE
Specific Gravity, Urine: 1.02 (ref 1.000–1.030)
Total Protein, Urine: NEGATIVE
Urine Glucose: NEGATIVE
Urobilinogen, UA: 1 (ref 0.0–1.0)
pH: 7 (ref 5.0–8.0)

## 2023-04-09 ENCOUNTER — Other Ambulatory Visit (INDEPENDENT_AMBULATORY_CARE_PROVIDER_SITE_OTHER): Payer: Medicaid Other

## 2023-04-09 DIAGNOSIS — D649 Anemia, unspecified: Secondary | ICD-10-CM

## 2023-04-09 LAB — FECAL OCCULT BLOOD, IMMUNOCHEMICAL: Fecal Occult Bld: NEGATIVE

## 2023-04-09 NOTE — Progress Notes (Signed)
Trace leucocytes in urine... Maybe going away. Recommend another repeat in 2-4 weeks, or sooner if the flank pain is persisting. There is no blood though so can't confirm kidney stone.  If any frequent urination or uncomfortable urination, he should try 7 days keflex just in case its a urinary tract infection (UTI).

## 2023-04-09 NOTE — Progress Notes (Signed)
Some experts believe that a yearly screen for blood in stool is adequate screening for colon cancer.  This was negative. Thanks for getting it done

## 2023-04-15 ENCOUNTER — Ambulatory Visit: Payer: Medicaid Other

## 2023-04-20 LAB — COLOGUARD: COLOGUARD: NEGATIVE

## 2023-04-22 NOTE — Progress Notes (Signed)
Notify patient if not seen in myChart: Cologuard testing was Negative, this is great news.  My usual recommendation for future testing is: Cologuard testing once every 3 years from age 62 to 75 years unless you have a particularly concerning family history of colon cancer, but we can do it more frequently if desired.  Roneshia Drew G Carinna Newhart, MD  04/22/2023 10:03 AM

## 2023-04-23 ENCOUNTER — Ambulatory Visit: Payer: Medicaid Other | Admitting: Cardiology

## 2023-04-24 ENCOUNTER — Other Ambulatory Visit: Payer: Medicaid Other

## 2023-04-27 ENCOUNTER — Encounter: Payer: Self-pay | Admitting: *Deleted

## 2023-04-28 ENCOUNTER — Other Ambulatory Visit: Payer: Self-pay

## 2023-04-28 ENCOUNTER — Other Ambulatory Visit: Payer: Self-pay | Admitting: Internal Medicine

## 2023-04-28 ENCOUNTER — Telehealth: Payer: Self-pay | Admitting: Internal Medicine

## 2023-04-28 MED ORDER — CEPHALEXIN 500 MG PO CAPS
500.0000 mg | ORAL_CAPSULE | Freq: Three times a day (TID) | ORAL | 0 refills | Status: DC
  Filled 2023-04-28: qty 30, 10d supply, fill #0

## 2023-04-28 NOTE — Telephone Encounter (Signed)
Pt would like a call back with Cologuard results.

## 2023-04-28 NOTE — Telephone Encounter (Signed)
Spoke to patient and gave him the results.  He had questions regarding the blood work that was done earlier.  Advised him of results and he is having urine frequency and a pin off/on in his flank area.  Does he need to come back in for a repeat urine or do you want to send in some Keflex for him?  Please review and advise.  Thanks. Dm/cma

## 2023-04-29 ENCOUNTER — Other Ambulatory Visit: Payer: Self-pay

## 2023-04-29 ENCOUNTER — Ambulatory Visit: Payer: Medicaid Other

## 2023-04-29 DIAGNOSIS — R82998 Other abnormal findings in urine: Secondary | ICD-10-CM

## 2023-04-30 ENCOUNTER — Other Ambulatory Visit: Payer: Medicaid Other

## 2023-05-04 ENCOUNTER — Other Ambulatory Visit: Payer: Medicaid Other

## 2023-05-04 ENCOUNTER — Inpatient Hospital Stay: Admission: RE | Admit: 2023-05-04 | Payer: Medicaid Other | Source: Ambulatory Visit

## 2023-05-15 ENCOUNTER — Ambulatory Visit: Payer: Medicaid Other | Admitting: Physical Therapy

## 2023-05-25 ENCOUNTER — Ambulatory Visit: Payer: Medicaid Other | Admitting: Cardiology

## 2023-06-01 ENCOUNTER — Other Ambulatory Visit (INDEPENDENT_AMBULATORY_CARE_PROVIDER_SITE_OTHER): Payer: Medicaid Other

## 2023-06-01 DIAGNOSIS — R82998 Other abnormal findings in urine: Secondary | ICD-10-CM

## 2023-06-01 LAB — URINALYSIS, ROUTINE W REFLEX MICROSCOPIC
Bilirubin Urine: NEGATIVE
Hgb urine dipstick: NEGATIVE
Ketones, ur: NEGATIVE
Leukocytes,Ua: NEGATIVE
Nitrite: NEGATIVE
RBC / HPF: NONE SEEN (ref 0–?)
Specific Gravity, Urine: 1.025 (ref 1.000–1.030)
Total Protein, Urine: NEGATIVE
Urine Glucose: NEGATIVE
Urobilinogen, UA: 0.2 (ref 0.0–1.0)
pH: 6 (ref 5.0–8.0)

## 2023-06-03 ENCOUNTER — Other Ambulatory Visit: Payer: Medicaid Other

## 2023-06-05 NOTE — Progress Notes (Signed)
Please call and notify, here's a possible script  "Great news! Dr.Omelia Marquart reviewed your recent testing and everything looks good!"  "We recommend setting up your MyChart account to easily view your results and communicate with us securely.  Having a MyChart account makes it easy to view your results, see them sooner, review them in detail, and even send us messages about them. Would you like help setting it up?"  "For any questions or to discuss further, please let us know. We appreciate you trusting us with your care!"

## 2023-06-16 ENCOUNTER — Ambulatory Visit: Payer: Medicaid Other | Admitting: Internal Medicine

## 2023-06-22 ENCOUNTER — Other Ambulatory Visit: Payer: Medicaid Other

## 2023-06-23 ENCOUNTER — Ambulatory Visit: Payer: Medicaid Other | Admitting: Internal Medicine

## 2023-07-01 ENCOUNTER — Other Ambulatory Visit: Payer: Medicaid Other

## 2023-07-10 ENCOUNTER — Other Ambulatory Visit: Payer: Medicaid Other

## 2023-07-20 ENCOUNTER — Other Ambulatory Visit: Payer: Medicaid Other

## 2023-07-22 ENCOUNTER — Ambulatory Visit: Payer: Medicaid Other | Admitting: Internal Medicine

## 2023-07-27 ENCOUNTER — Ambulatory Visit: Payer: Medicaid Other | Admitting: Internal Medicine

## 2023-07-27 ENCOUNTER — Encounter: Payer: Self-pay | Admitting: Internal Medicine

## 2023-07-27 VITALS — BP 118/73 | HR 62 | Temp 98.1°F | Ht 73.0 in | Wt 168.8 lb

## 2023-07-27 DIAGNOSIS — R109 Unspecified abdominal pain: Secondary | ICD-10-CM

## 2023-07-27 DIAGNOSIS — B351 Tinea unguium: Secondary | ICD-10-CM | POA: Diagnosis not present

## 2023-07-27 DIAGNOSIS — K130 Diseases of lips: Secondary | ICD-10-CM | POA: Diagnosis not present

## 2023-07-27 MED ORDER — METHOCARBAMOL 500 MG PO TABS: 500.0000 mg | ORAL_TABLET | Freq: Four times a day (QID) | ORAL | 1 refills | Status: DC | PRN

## 2023-07-27 MED ORDER — CICLOPIROX OLAMINE 0.77 % EX CREA
TOPICAL_CREAM | Freq: Two times a day (BID) | CUTANEOUS | 0 refills | Status: AC
Start: 2023-07-27 — End: ?

## 2023-07-27 MED ORDER — SALONPAS 3.1-6-10 % EX PTCH: 1.0000 | MEDICATED_PATCH | Freq: Every day | CUTANEOUS | 0 refills | Status: AC

## 2023-07-27 MED ORDER — DICLOFENAC SODIUM 1 % EX GEL: 4.0000 g | Freq: Four times a day (QID) | CUTANEOUS | 11 refills | Status: DC | PRN

## 2023-07-27 NOTE — Patient Instructions (Signed)
VISIT SUMMARY:  During your visit, we discussed your ongoing right lower flank pain, a persistent lesion on your lip, and a fungal infection on your toenails. We also reviewed your general health maintenance and planned follow-up appointments.  YOUR PLAN:  -RIGHT LOWER FLANK PAIN: This is a chronic, intermittent pain in your lower right back, likely due to muscle strain. We will get an X-ray of your lower back, prescribe a muscle relaxant for use as needed, and recommend over-the-counter topical anti-inflammatory and numbing agents.  -LIP LESION: This is a non-healing sore on your lip that has been present for 1.5-2 months. We will urgently refer you to a dermatologist for further evaluation and possible removal.  -TOENAIL FUNGUS: This is a persistent fungal infection on your toenails that has not responded to over-the-counter treatments. We will prescribe a cream called Cyclopirox for daily application.  INSTRUCTIONS:  Please schedule an X-ray at [facility] for your lower back pain. Apply the prescribed Cyclopirox cream daily to your toenails. We have arranged an urgent referral to a dermatologist for your lip lesion. Continue with the scheduled imaging studies for your liver and lungs. We have scheduled a follow-up appointment in two months to reassess your current issues and conduct a four-month review.

## 2023-07-27 NOTE — Assessment & Plan Note (Signed)
Ordered and gave XR handout to patient to take with him to [facility] [place] Suspect(s) musculoskeletal pain associated with degenerative disk disease, XR to further evaluate Trial treatment with anti-inflammatory topicals and muscle relaxer(s)  He experiences chronic, intermittent pain in the right lower flank, likely muscular, worsened by certain movements and alleviated by slow straightening. We will order a lumbar spine X-ray at [facility], prescribe a muscle relaxant for as-needed use, and recommend over-the-counter topical anti-inflammatory and numbing agents.

## 2023-07-27 NOTE — Progress Notes (Signed)
[facility]: [phone]   Routine Medical Office Visit  Patient:  Derek Bullock      Age: 62 y.o.       Sex:  male  Date:   07/27/2023 Patient Care Team: Derek Olszewski, MD as PCP - General (Internal Medicine) Derek Salvia, MD as Consulting Physician (Cardiology) Today's Healthcare Provider: Lula Olszewski, MD   Assessment and Plan:   Derek Bullock was seen today for 4 month follow-up.  Flank pain Overview: Started around 2021, comes and goes. Always right flank lower toward posterior. Pain area about the size of baseball Pain is rarely severe, feels like a vice sometimes. Has to straighten up slowly when its flaring to prevent the pain from .   Orders: -     DG Lumbar Spine Complete; Future -     Methocarbamol; Take 1 tablet (500 mg total) by mouth every 6 (six) hours as needed for muscle spasms. Muscle relaxer for back pain  Dispense: 30 tablet; Refill: 1 -     Diclofenac Sodium; Apply 4 g topically 4 (four) times daily as needed.  Dispense: 100 g; Refill: 11 -     Salonpas; Apply 1 Act topically daily at 6 (six) AM.  Dispense: 6 patch; Refill: 0  Onychomycosis -     Ciclopirox Olamine; Apply topically 2 (two) times daily.  Dispense: 90 g; Refill: 0  Lesion of lip -     Ambulatory referral to Dermatology -     Ambulatory referral to Oral Maxillofacial Surgery     Assessment & Plan Flank pain Ordered and gave XR handout to patient to take with him to [facility] Suspect(s) musculoskeletal pain associated with degenerative disk disease, XR to further evaluate Trial treatment with anti-inflammatory topicals and muscle relaxer(s)  He experiences chronic, intermittent pain in the right lower flank, likely muscular, worsened by certain movements and alleviated by slow straightening. We will order a lumbar spine X-ray at [facility], prescribe a muscle relaxant for as-needed use, and recommend over-the-counter topical  anti-inflammatory and numbing agents. Onychomycosis  Moderate diffuse oncychomycosis failing to respond to OTC (available over the counter without a prescription) He suffers from a chronic fungal infection of the toenails, which has not responded to over-the-counter treatments. We will prescribe Cyclopirox cream for daily application. Lesion of lip patient has this this lesion on his lip for about a month.  its cracked and a little tender. its now growing.  he did not bite his lip, and it wasnt there before last month.  create a differential diagnosis, ranked in order of likelihood, and advise on whether it should be referred for biopsy and who to  He has a persistent, non-healing lesion on the lip for approximately 1.5-2 months, not related to trauma or cold sores. An urgent referral to dermatology for evaluation and possible excision is planned.   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 The appearance and history are highly suspicious for a malignant process, most likely squamous cell carcinoma.  Referral will be to an oral and maxillofacial surgeon or a dermatologist with experience in oral lesions. They can perform a biopsy and determine the appropriate course of treatment based on the results.  The  urgency is due to the potential for rapid growth and metastasis of oral squamous cell carcinomas. Early diagnosis and treatment significantly improve prognosis.  patient advised to avoid irritating the area further and to report any changes in size, appearance, or symptoms-and call our office if doesn't have appointment within a week. Also asked for close follow  up in office within 1-2 months to make sure this is taken care of.    General Health Maintenance / Followup Plans We will continue with the scheduled imaging studies for his liver and lungs. A follow-up appointment in two months is scheduled to reassess current issues and conduct a four-month review.       ED Discharge Orders          Ordered    DG Lumbar Spine Complete        07/27/23 1000    methocarbamol (ROBAXIN) 500 MG tablet  Every 6 hours PRN        07/27/23 1000    diclofenac Sodium (VOLTAREN) 1 % GEL  4 times daily PRN        07/27/23 1000    Camphor-Menthol-Methyl Sal (SALONPAS) 3.12-27-08 % PTCH  Daily        07/27/23 1000    ciclopirox (LOPROX) 0.77 % cream  2 times daily        07/27/23 1000    Ambulatory referral to Dermatology  Status:  Canceled       Comments: 1 month non healing and cracked inner lip lesion.  Urgent biopsy or destruction evaluation requested.   07/27/23 1000    Ambulatory referral to Dermatology       Comments: 1 month non healing and cracked inner lip lesion.  Urgent biopsy or destruction evaluation requested. Referral should be to an oral and maxillofacial surgeon or a dermatologist with experience in oral lesions.   07/27/23 1011    Ambulatory referral to Oral Maxillofacial Surgery       Comments: 1 month non healing and cracked inner lip lesion.  Urgent biopsy or destruction evaluation requested. Referral should be to an oral and maxillofacial surgeon or a dermatologist with experience in oral lesions.   07/27/23 1011            Recommended follow up: 1-2 months  Future Appointments  Date Time Provider Department Center  08/03/2023 10:30 AM [facility] [place] [facility]  08/03/2023  1:20 PM [facility] 1 [facility]  08/04/2023  2:40 PM Derek Red, MD [facility]  09/07/2023  9:20 AM Derek Olszewski, MD [facility]           Clinical Presentation:    62 y.o. male who has Gynecomastia, male; History of  colon polyps; Palpitations; Limping; Cirrhosis of liver (HCC); Distal interphalangeal nodule; History of smoking 25-50 pack years; History of pneumonia; GERD (gastroesophageal reflux disease); Hyperlipidemia, acquired; RUQ pain; Flank pain; and Anemia on their problem list. His reasons/main concerns/chief complaints for today's office visit are 4 month follow-up   AI-Extracted: Discussed the use of AI scribe software for clinical note transcription with the patient, who gave verbal consent to proceed.  History of Present Illness   The patient, with a history of liver disease and alcohol use disorder, presents with intermittent right lower flank pain that has been ongoing for several years. The pain is described as a muscle-like squeezing sensation, localized to an area the size of a baseball on the far right lower back. The pain is not  constant and does not seem to be associated with any specific movements, but is noted to be more prominent upon standing from a seated position. The patient reports that the pain is not severe, but feels like a clamp or vise.  Additionally, the patient reports a persistent lesion on the lip that has been present for approximately one and a half to two months. The lesion is described as a crack that is slightly tender, particularly upon waking in the morning. The patient denies any prior abnormality in this area.  The patient also reports a persistent fungal infection of the toenails, which has not responded to over-the-counter treatments. The patient has been abstinent from alcohol and has not required the use of medications for withdrawal symptoms.      He  has a past medical history of Adenomatous colon polyp, Alcohol dependence with other alcohol-induced disorder (HCC) (11/24/2022), Arrhythmia (12/22/2005), Arthritis, Bilateral impacted cerumen (11/24/2022), Cerumen impaction (12/31/2022), Diverticulosis, GERD (gastroesophageal reflux disease), Hemorrhoid (12/22/1998),  Hyperlipidemia, Internal and external hemorrhoids without complication, Limping (11/24/2022), Tinnitus of right ear (11/24/2022), and Tobacco abuse (11/22/2012). Current Outpatient Medications on File Prior to Visit  Medication Sig   cephALEXin (KEFLEX) 500 MG capsule Take 1 capsule (500 mg total) by mouth 3 (three) times daily.   cyanocobalamin (VITAMIN B12) 1000 MCG/ML injection Inject into the muscle.   famotidine-calcium carbonate-magnesium hydroxide (PEPCID COMPLETE) 10-800-165 MG chewable tablet Chew 1 tablet by mouth daily as needed (take these only as needed for heartburn acid symptoms).   Multiple Vitamins-Minerals (ONE DAILY FOR MEN 50+ ADVANCED) TABS Take 1 tablet by mouth daily.   multivitamin (ONE-A-DAY MEN'S) TABS tablet Take 1 tablet by mouth daily.   omeprazole (PRILOSEC) 20 MG capsule Take 1 capsule (20 mg total) by mouth daily for 90 days, THEN 1 capsule (20 mg total) every other day. Replaces 40 mg dose.  Tapering off.  Use pepcid complete for heartburn flares as you taper and after tapering completed.   thiamine (VITAMIN B1) 100 MG tablet Take 1 tablet (100 mg total) by mouth daily.   No current facility-administered medications on file prior to visit.   There are no discontinued medications.       Clinical Data Analysis:   Physical Exam  BP 118/73 (BP Location: Left Arm, Patient Position: Sitting)   Pulse 62   Temp 98.1 F (36.7 C) (Temporal)   Ht 6\' 1"  (1.854 m)   Wt 168 lb 12.8 oz (76.6 kg)   SpO2 98%   BMI 22.27 kg/m  Wt Readings from Last 10 Encounters:  07/27/23 168 lb 12.8 oz (76.6 kg)  03/27/23 175 lb 3.2 oz (79.5 kg)  03/06/23 179 lb 6.4 oz (81.4 kg)  02/04/23 182 lb 12.8 oz (82.9 kg)  12/31/22 182 lb 6.4 oz (82.7 kg)  11/24/22 186 lb 6.4 oz (84.6 kg)  04/10/16 139 lb (63 kg)  04/02/16 139 lb (63 kg)  02/18/16 143 lb (64.9 kg)  06/21/15 150 lb (68 kg)   Vital signs reviewed.  Nursing notes reviewed. Weight trend reviewed. Abnormalities and  Problem-Specific physical exam findings:  see assessment / plan for lip lesion and onychomycosis.  Slow lumbar extension in last 5 degrees limited by the flank pain.  Walks well.  General Appearance:  No acute distress appreciable.   Well-groomed, healthy-appearing male.  Well proportioned with no abnormal fat distribution.  Good muscle tone. Skin: Clear and well-hydrated besides the lip lesion. Pulmonary:  Normal work of breathing at rest, no respiratory distress  apparent. SpO2: 98 %  Musculoskeletal: All extremities are intact.  Neurological:  Awake, alert, oriented, and engaged.  No obvious focal neurological deficits or cognitive impairments.  Sensorium seems unclouded.   Speech is clear and coherent with logical content. Psychiatric:  Appropriate mood, pleasant and cooperative demeanor, thoughtful and engaged during the exam  Results Reviewed:      No results found for any visits on 07/27/23.  Lab on 06/01/2023  Component Date Value   Color, Urine 06/01/2023 YELLOW    APPearance 06/01/2023 CLEAR    Specific Gravity, Urine 06/01/2023 1.025    pH 06/01/2023 6.0    Total Protein, Urine 06/01/2023 NEGATIVE    Urine Glucose 06/01/2023 NEGATIVE    Ketones, ur 06/01/2023 NEGATIVE    Bilirubin Urine 06/01/2023 NEGATIVE    Hgb urine dipstick 06/01/2023 NEGATIVE    Urobilinogen, UA 06/01/2023 0.2    Leukocytes,Ua 06/01/2023 NEGATIVE    Nitrite 06/01/2023 NEGATIVE    WBC, UA 06/01/2023 0-2/hpf    RBC / HPF 06/01/2023 none seen    Squamous Epithelial / HPF 06/01/2023 Rare(0-4/hpf)   Lab on 04/09/2023  Component Date Value   Fecal Occult Bld 04/09/2023 Negative   Lab on 04/08/2023  Component Date Value   Color, Urine 04/08/2023 YELLOW    APPearance 04/08/2023 CLEAR    Specific Gravity, Urine 04/08/2023 1.020    pH 04/08/2023 7.0    Total Protein, Urine 04/08/2023 NEGATIVE    Urine Glucose 04/08/2023 NEGATIVE    Ketones, ur 04/08/2023 NEGATIVE    Bilirubin Urine 04/08/2023 NEGATIVE     Hgb urine dipstick 04/08/2023 NEGATIVE    Urobilinogen, UA 04/08/2023 1.0    Leukocytes,Ua 04/08/2023 TRACE (A)    Nitrite 04/08/2023 NEGATIVE    WBC, UA 04/08/2023 3-6/hpf (A)    RBC / HPF 04/08/2023 0-2/hpf    Mucus, UA 04/08/2023 Presence of (A)    Squamous Epithelial / HPF 04/08/2023 Rare(0-4/hpf)    WBC 04/08/2023 6.3    RBC 04/08/2023 3.74 (L)    Hemoglobin 04/08/2023 12.2 (L)    HCT 04/08/2023 35.4 (L)    MCV 04/08/2023 94.6    MCHC 04/08/2023 34.6    RDW 04/08/2023 14.1    Platelets 04/08/2023 325.0    Neutrophils Relative % 04/08/2023 48.8    Lymphocytes Relative 04/08/2023 37.9    Monocytes Relative 04/08/2023 10.1    Eosinophils Relative 04/08/2023 2.4    Basophils Relative 04/08/2023 0.8    Neutro Abs 04/08/2023 3.1    Lymphs Abs 04/08/2023 2.4    Monocytes Absolute 04/08/2023 0.6    Eosinophils Absolute 04/08/2023 0.2    Basophils Absolute 04/08/2023 0.0   Office Visit on 03/27/2023  Component Date Value   Color, Urine 03/27/2023 DARK YELLOW    APPearance 03/27/2023 CLEAR    Specific Gravity, Urine 03/27/2023 1.018    pH 03/27/2023 7.5    Glucose, UA 03/27/2023 NEGATIVE    Bilirubin Urine 03/27/2023 NEGATIVE    Ketones, ur 03/27/2023 NEGATIVE    Hgb urine dipstick 03/27/2023 NEGATIVE    Protein, ur 03/27/2023 NEGATIVE    Nitrite 03/27/2023 NEGATIVE    Leukocytes,Ua 03/27/2023 NEGATIVE    WBC, UA 03/27/2023 6-10 (A)    RBC / HPF 03/27/2023 NONE SEEN    Squamous Epithelial / HPF 03/27/2023 0-5    Bacteria, UA 03/27/2023 NONE SEEN    Hyaline Cast 03/27/2023 NONE SEEN    Note 03/27/2023    Office Visit on 03/06/2023  Component Date Value   COLOGUARD 04/11/2023 Negative  Color, Urine 03/06/2023 YELLOW    APPearance 03/06/2023 CLEAR    Specific Gravity, Urine 03/06/2023 1.015    pH 03/06/2023 6.5    Glucose, UA 03/06/2023 NEGATIVE    Bilirubin Urine 03/06/2023 NEGATIVE    Ketones, ur 03/06/2023 NEGATIVE    Hgb urine dipstick 03/06/2023 NEGATIVE     Protein, ur 03/06/2023 NEGATIVE    Nitrite 03/06/2023 NEGATIVE    Leukocytes,Ua 03/06/2023 NEGATIVE    WBC, UA 03/06/2023 NONE SEEN    RBC / HPF 03/06/2023 NONE SEEN    Squamous Epithelial / HPF 03/06/2023 0-5    Bacteria, UA 03/06/2023 NONE SEEN    Hyaline Cast 03/06/2023 NONE SEEN    Note 03/06/2023     Sodium 03/06/2023 141    Potassium 03/06/2023 4.3    Chloride 03/06/2023 107    CO2 03/06/2023 29    Glucose, Bld 03/06/2023 90    BUN 03/06/2023 11    Creatinine, Ser 03/06/2023 0.85    Total Bilirubin 03/06/2023 0.5    Alkaline Phosphatase 03/06/2023 70    AST 03/06/2023 15    ALT 03/06/2023 13    Total Protein 03/06/2023 6.3    Albumin 03/06/2023 3.9    GFR 03/06/2023 93.33    Calcium 03/06/2023 9.6    WBC 03/06/2023 6.0    RBC 03/06/2023 3.93 (L)    Platelets 03/06/2023 326.0    Hemoglobin 03/06/2023 12.9 (L)    HCT 03/06/2023 37.2 (L)    MCV 03/06/2023 94.7    MCHC 03/06/2023 34.6    RDW 03/06/2023 13.7    Amylase 03/06/2023 31    Lipase 03/06/2023 15.0   Office Visit on 11/24/2022  Component Date Value   WBC 11/24/2022 5.0    RBC 11/24/2022 4.02 (L)    Hemoglobin 11/24/2022 13.8    HCT 11/24/2022 40.5    MCV 11/24/2022 100.8 (H)    MCHC 11/24/2022 34.1    RDW 11/24/2022 14.0    Platelets 11/24/2022 265.0    Neutrophils Relative % 11/24/2022 46.2    Lymphocytes Relative 11/24/2022 35.0    Monocytes Relative 11/24/2022 15.7 (H)    Eosinophils Relative 11/24/2022 1.9    Basophils Relative 11/24/2022 1.2    Neutro Abs 11/24/2022 2.3    Lymphs Abs 11/24/2022 1.7    Monocytes Absolute 11/24/2022 0.8    Eosinophils Absolute 11/24/2022 0.1    Basophils Absolute 11/24/2022 0.1    Sodium 11/24/2022 134 (L)    Potassium 11/24/2022 5.1    Chloride 11/24/2022 98    CO2 11/24/2022 28    Glucose, Bld 11/24/2022 82    BUN 11/24/2022 6    Creatinine, Ser 11/24/2022 0.81    Total Bilirubin 11/24/2022 0.4    Alkaline Phosphatase 11/24/2022 81    AST 11/24/2022 51  (H)    ALT 11/24/2022 44    Total Protein 11/24/2022 7.0    Albumin 11/24/2022 4.5    GFR 11/24/2022 94.89    Calcium 11/24/2022 9.6    Cholesterol 11/24/2022 219 (H)    Triglycerides 11/24/2022 48.0    HDL 11/24/2022 113.20    VLDL 11/24/2022 9.6    LDL Cholesterol 11/24/2022 96    Total CHOL/HDL Ratio 11/24/2022 2    NonHDL 11/24/2022 105.94    No image results found.   No results found.  DG Chest 2 View  Result Date: 11/27/2021 CLINICAL DATA:  Persistent cough over the last 3 weeks EXAM: CHEST - 2 VIEW COMPARISON:  05/14/2015 FINDINGS: Heart size is normal. Mediastinal shadows  are normal. There is mild central bronchial thickening but there is no infiltrate, collapse or effusion. Ordinary mild degenerative changes affect the spine. IMPRESSION: Possible bronchitis.  No consolidation or collapse. Electronically Signed   By: Paulina Fusi M.D.   On: 11/27/2021 10:19      This encounter employed real-time, collaborative documentation. The patient actively reviewed and updated their medical record on a shared screen, ensuring transparency and facilitating joint problem-solving for the problem list, overview, and plan. This approach promotes accurate, informed care. The treatment plan was discussed and reviewed in detail, including medication safety, potential side effects, and all patient questions. We confirmed understanding and comfort with the plan. Follow-up instructions were established, including contacting the office for any concerns, returning if symptoms worsen, persist, or new symptoms develop, and precautions for potential emergency department visits. ----------------------------------------------------- Derek Olszewski, MD  07/27/2023 10:14 AM  [facility] Office:  [phone]

## 2023-07-28 ENCOUNTER — Other Ambulatory Visit: Payer: Medicaid Other

## 2023-08-03 ENCOUNTER — Other Ambulatory Visit: Payer: Medicaid Other

## 2023-08-04 ENCOUNTER — Ambulatory Visit (HOSPITAL_BASED_OUTPATIENT_CLINIC_OR_DEPARTMENT_OTHER): Payer: Medicaid Other | Admitting: Cardiology

## 2023-08-12 ENCOUNTER — Other Ambulatory Visit: Payer: Medicaid Other

## 2023-08-25 ENCOUNTER — Other Ambulatory Visit: Payer: Medicaid Other

## 2023-08-25 ENCOUNTER — Ambulatory Visit: Payer: Medicaid Other | Admitting: Dermatology

## 2023-09-07 ENCOUNTER — Encounter: Payer: Self-pay | Admitting: Internal Medicine

## 2023-09-07 ENCOUNTER — Ambulatory Visit: Payer: Medicaid Other | Admitting: Internal Medicine

## 2023-09-07 VITALS — BP 102/64 | HR 72 | Temp 98.2°F | Ht 73.0 in | Wt 168.4 lb

## 2023-09-07 DIAGNOSIS — B351 Tinea unguium: Secondary | ICD-10-CM | POA: Insufficient documentation

## 2023-09-07 DIAGNOSIS — R1011 Right upper quadrant pain: Secondary | ICD-10-CM

## 2023-09-07 DIAGNOSIS — R109 Unspecified abdominal pain: Secondary | ICD-10-CM

## 2023-09-07 DIAGNOSIS — R2689 Other abnormalities of gait and mobility: Secondary | ICD-10-CM

## 2023-09-07 DIAGNOSIS — D649 Anemia, unspecified: Secondary | ICD-10-CM | POA: Diagnosis not present

## 2023-09-07 DIAGNOSIS — M151 Heberden's nodes (with arthropathy): Secondary | ICD-10-CM | POA: Diagnosis not present

## 2023-09-07 DIAGNOSIS — M19049 Primary osteoarthritis, unspecified hand: Secondary | ICD-10-CM | POA: Insufficient documentation

## 2023-09-07 DIAGNOSIS — M19241 Secondary osteoarthritis, right hand: Secondary | ICD-10-CM

## 2023-09-07 DIAGNOSIS — K130 Diseases of lips: Secondary | ICD-10-CM

## 2023-09-07 LAB — IBC + FERRITIN
Ferritin: 34.4 ng/mL (ref 22.0–322.0)
Iron: 68 ug/dL (ref 42–165)
Saturation Ratios: 21.5 % (ref 20.0–50.0)
TIBC: 316.4 ug/dL (ref 250.0–450.0)
Transferrin: 226 mg/dL (ref 212.0–360.0)

## 2023-09-07 LAB — RETICULOCYTES
ABS Retic: 40600 {cells}/uL (ref 25000–90000)
Retic Ct Pct: 1 %

## 2023-09-07 LAB — FERRITIN: Ferritin: 34.4 ng/mL (ref 22.0–322.0)

## 2023-09-07 LAB — C-REACTIVE PROTEIN: CRP: <1 mg/dL (ref 0.5–20.0)

## 2023-09-07 LAB — VITAMIN B12: Vitamin B-12: 404 pg/mL (ref 211–911)

## 2023-09-07 LAB — SEDIMENTATION RATE: Sed Rate: 5 mm/h (ref 0–20)

## 2023-09-07 LAB — FOLATE: Folate: >24.2 ng/mL (ref >5.9)

## 2023-09-07 MED ORDER — METHOCARBAMOL 500 MG PO TABS: 500.0000 mg | ORAL_TABLET | Freq: Four times a day (QID) | ORAL | 1 refills | Status: DC | PRN

## 2023-09-07 MED ORDER — DICLOFENAC SODIUM 1 % EX GEL: 4.0000 g | Freq: Four times a day (QID) | CUTANEOUS | 11 refills | Status: DC | PRN

## 2023-09-07 NOTE — Assessment & Plan Note (Signed)
His chronic limping gait, accompanied by knee and hip pain, has an unclear etiology. We will refer him to physical therapy for evaluation and treatment.

## 2023-09-07 NOTE — Patient Instructions (Addendum)
Please go to our [facility] office to get your xrays done. You can walk in M-F between 8:30am- noon or 1pm - 5pm. Tell them you are there for xrays ordered by me. They will send me the results, then I will let you know the results with instructions.   Address: [address].  The Xray department is located in the basement.   Place anemia patient instructions here.   It was a pleasure seeing you today! Your health and satisfaction are our top priorities.  Glenetta Hew, MD  VISIT SUMMARY:  During your visit, we discussed several health concerns including your arthritis, limping gait, anemia, flank pain, nail fungus, and a spot on your lip. We have outlined a plan to address each of these issues, which includes referrals to specialists, additional testing, and continued use of certain treatments.  YOUR PLAN:  -OSTEOARTHRITIS: Osteoarthritis is a condition that causes joints to become painful and stiff. We will refer you to a hand specialist for further evaluation and possible treatment options. Please continue using your over-the-counter pain relief cream.  -LIMPING GAIT: Your limping walk, along with knee and hip pain, is not yet fully understood. We will refer you to physical therapy for evaluation and treatment.  -ANEMIA: Anemia is a condition where your body lacks enough healthy red blood cells to carry adequate oxygen to your body's tissues. We will order comprehensive blood work to investigate the cause of your mild anemia.  -FLANK PAIN: Your chronic, intermittent pain in the side of your body responds to muscle relaxants, suggesting it may be related to your muscles or bones. We will order X-rays to investigate potential causes and continue muscle relaxants as needed.  -NAIL FUNGUS: Your nail fungus is improving with over-the-counter treatment. Please continue your current treatment regimen.  -LIP LESION: We suspect the persistent spot on your lip may be skin cancer. You  have a dermatology appointment scheduled for late October. Please monitor the lesion for growth, and if it enlarges, contact our office for an expedited dermatology evaluation.  INSTRUCTIONS:  We will follow up in 1-2 months to monitor progress and address any new concerns. In the meantime, please follow the outlined plan, attend your scheduled appointments, and contact our office if you have any questions or concerns.  Your Providers PCP: Lula Olszewski, MD,  [phone]) Referring Provider: Lula Olszewski, MD,  [phone]) Care Team Provider: Duke Salvia, MD,  [phone])  NEXT STEPS: [x]  Early Intervention: Schedule sooner appointment, call our on-call services, or go to emergency room if there is any significant Increase in pain or discomfort New or worsening symptoms Sudden or severe changes in your health [x]  Flexible Follow-Up: We recommend a Return in about 2 months (around 11/07/2023). for optimal routine care. This allows for progress monitoring and treatment adjustments. [x]  Preventive Care: Schedule your annual preventive care visit! It's typically covered by insurance and helps identify potential health issues early. [x]  Lab & X-ray Appointments: Incomplete tests scheduled today, or call to schedule. X-rays: [facility] (M-F, 8:30am-noon or 1pm-5pm). [x]  Medical Information Release: Sign a release form at front desk to obtain relevant medical information we don't have.  MAKING THE MOST OF OUR FOCUSED 20 MINUTE APPOINTMENTS: [x]   Clearly state your top concerns at the beginning of the visit to focus our discussion [x]   If you anticipate you will need more time, please inform the front desk during scheduling - we can book multiple appointments in  the same week. [x]   If you have transportation problems- use our convenient video appointments or ask about transportation support. [x]   We can get down to business faster if you use MyChart to update  information before the visit and submit non-urgent questions before your visit. Thank you for taking the time to provide details through MyChart.  Let our nurse know and she can import this information into your encounter documents.  Arrival and Wait Times: [x]   Arriving on time ensures that everyone receives prompt attention. [x]   Early morning (8a) and afternoon (1p) appointments tend to have shortest wait times. [x]   Unfortunately, we cannot delay appointments for late arrivals or hold slots during phone calls.  Getting Answers and Following Up [x]   Simple Questions & Concerns: For quick questions or basic follow-up after your visit, reach [place] at [phone] or MyChart messaging. [x]   Complex Concerns: If your concern is more complex, scheduling an appointment might be best. Discuss this with the staff to find the most suitable option. [x]   Lab & Imaging Results: We'll contact you directly if results are abnormal or you don't use MyChart. Most normal results will be on MyChart within 2-3 business days, with a review message from Dr. Jon Billings. Haven't heard back in 2 weeks? Need results sooner? Contact us at [phone]. [x]   Referrals: Our referral coordinator will manage specialist referrals. The specialist's office should contact you within 2 weeks to schedule an appointment. Call us if you haven't heard from them after 2 weeks.  Staying Connected [x]   MyChart: Activate your MyChart for the fastest way to access results and message [place]. See the last page of this paperwork for instructions on how to activate.  Bring to Your Next Appointment [x]   Medications: Please bring all your medication bottles to your next appointment to ensure we have an accurate record of your prescriptions. [x]   Health Diaries: If you're monitoring any health conditions at home, keeping a diary of your readings can be very helpful for discussions at your next appointment.  Billing [x]   X-ray & Lab Orders: These  are billed by separate companies. Contact the invoicing company directly for questions or concerns. [x]   Visit Charges: Discuss any billing inquiries with our administrative services team.  Your Satisfaction Matters [x]   Share Your Experience: We strive for your satisfaction! If you have any complaints, or preferably compliments, please let Dr. Jon Billings know directly or contact our Practice Administrators, Edwena Felty or [employer], by asking at the front desk.   Reviewing Your Records [x]   Review this early draft of your clinical encounter notes below and the final encounter summary tomorrow on MyChart after its been completed.  All orders placed so far are visible here: Distal interphalangeal nodule  Other secondary osteoarthritis of both hands -     Ambulatory referral to Physical Therapy -     Ambulatory referral to Orthopedic Surgery  Flank pain -     Ambulatory referral to Physical Therapy -     Methocarbamol; Take 1 tablet (500 mg total) by mouth every 6 (six) hours as needed for muscle spasms. Muscle relaxer for back pain  Dispense: 60 tablet; Refill: 1 -     Diclofenac Sodium; Apply 4 g topically 4 (four) times daily as needed.  Dispense: 100 g; Refill: 11  Anemia, unspecified type -     C-reactive protein -     Sedimentation rate -     Folate -     Vitamin B12 -  Ferritin -     Iron and TIBC -     Reticulocytes -     CBC -     Direct antiglobulin test (not at [facility]) -     Haptoglobin -     Lactate dehydrogenase -     Pathologist smear review -     TSH Rfx on Abnormal to Free T4  Gastroesophageal reflux disease without esophagitis  Limping -     Ambulatory referral to Physical Therapy

## 2023-09-07 NOTE — Assessment & Plan Note (Signed)
Severe hand arthritic changes we will see if orthopedic hand specialist can improve function any. 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

## 2023-09-07 NOTE — Assessment & Plan Note (Signed)
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 We discussed the disease's pathophysiology and treatment options. We will refer him to an orthopedic hand specialist for further evaluation and possible interventions. He should continue using over-the-counter topical diclofenac sodium for pain relief.

## 2023-09-07 NOTE — Assessment & Plan Note (Signed)
He has mild anemia, which has shown a downward trend on previous checks, with an unclear cause. We will order comprehensive blood work to investigate the cause and consider GI bleeding as a potential cause, given his history of hemorrhoids.

## 2023-09-07 NOTE — Assessment & Plan Note (Signed)
His chronic, intermittent flank pain responds to muscle relaxants, suggesting a musculoskeletal etiology. We will order lumbar spine and abdominal X-rays to investigate potential causes and continue muscle relaxants as needed. We will also add him to the physical therapy referral for potential treatment.

## 2023-09-07 NOTE — Progress Notes (Signed)
[facility]: [phone]   -- Medical Office Visit --  Patient:  Derek Bullock      Age: 62 y.o.       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 We discussed the disease's pathophysiology and treatment options. We will refer him to an orthopedic hand specialist for further evaluation and possible interventions. He should continue using over-the-counter topical diclofenac sodium for pain relief. Distal interphalangeal nodule Severe hand arthritic changes we will see if orthopedic hand specialist can improve function any. 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 Flank pain His chronic, intermittent flank pain responds to muscle relaxants, suggesting a musculoskeletal etiology. We will order lumbar spine and abdominal X-rays to investigate potential causes and continue muscle relaxants as needed. We will also add him to the physical therapy referral for potential treatment. Anemia, unspecified type He has mild anemia, which has shown a downward trend on previous checks, with an unclear cause. We will order comprehensive blood work to investigate the cause and consider GI bleeding as a potential cause, given his history of hemorrhoids. Limping His chronic limping gait, accompanied by knee and hip pain, has an unclear etiology. We will refer him to physical therapy for evaluation and treatment. Onychomycosis His nail fungus is improving with over-the-counter treatment. He should continue his current treatment regimen. Lesion of lip We suspect skin cancer in his lip lesion, with a dermatology appointment scheduled for  late October. He should monitor the lesion for growth, and if it enlarges, contact our office for an expedited dermatology evaluation. RUQ pain   We will follow up in 1-2 months to monitor progress and address any new concerns.       Diagnoses and all orders for this visit: Distal interphalangeal nodule Other secondary osteoarthritis of both hands -     Ambulatory referral to Physical Therapy -     Ambulatory referral to Orthopedic Surgery Flank pain -     Ambulatory referral to Physical Therapy -     methocarbamol (ROBAXIN) 500 MG tablet; Take 1 tablet (500 mg total) by mouth every 6 (six) hours as needed for muscle spasms. Muscle relaxer for back pain -     diclofenac Sodium (VOLTAREN) 1 % GEL; Apply 4 g topically 4 (four) times daily as needed. Anemia, unspecified type -     C-reactive protein -     Sedimentation rate, automated -     Folate -     Vitamin B12 -     Ferritin -     Iron and TIBC -     Reticulocytes -     CBC -     Direct antiglobulin (direct Coombs) test -     Haptoglobin -     Lactate dehydrogenase -     Pathologist smear review -     TSH Rfx on Abnormal to Free T4 Gastroesophageal reflux disease without esophagitis Limping -     Ambulatory referral to Physical Therapy  Recommended follow-up: Return in about 2 months (around 11/07/2023). Future Appointments  Date Time Provider Department Center  09/25/2023  8:00 AM [facility] [place] [facility]  [facility]  09/25/2023  1:20 PM [facility] 1 [facility]  10/13/2023  2:45 PM Paci, Daisey Must, MD [facility] None  10/26/2023 10:40 AM Jodelle Red, MD [facility]  11/09/2023 10:20 AM Lula Olszewski, MD [facility]            Subjective   62 y.o. male who has Gynecomastia, male; History of colon polyps; Palpitations; Limping; Cirrhosis of liver (HCC); Distal interphalangeal nodule; History of smoking 25-50 pack years; History of pneumonia; GERD (gastroesophageal reflux disease); Hyperlipidemia,  acquired; RUQ pain; Flank pain; and Anemia on their problem list. His reasons/main concerns/chief complaints for today's office visit are 1 month follow-up   ------------------------------------------------------------------------------------------------------------------------ AI-Extracted: Discussed the use of AI scribe software for clinical note transcription with the patient, who gave verbal consent to proceed.  History of Present Illness   The patient, with a history of arthritis, presents with a persistent limp of unknown etiology. He reports pain in the knee and hip associated with the limp, which has not improved over time. The patient also notes worsening arthritis in his fingers, with new onset of symptoms in the wrist. He attributes the worsening symptoms to colder weather. 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  The patient also reports a persistent spot on his lip, present for two to three months, which is scheduled to be evaluated by a dermatologist in October. He expresses concern about the delay in evaluation but is willing to wait unless the spot grows in size.  In addition to these concerns, the patient mentions intermittent right flank pain, which he has been managing with over-the-counter muscle relaxers. He reports that the pain feels like it is located in the kidney area, but it responds to muscle relaxers, suggesting a musculoskeletal origin. The patient has not yet pursued an ordered x-ray to further investigate this issue.  Lastly, the patient has been managing a nail fungus with over-the-counter treatments, which he reports is improving. He has been avoiding oral medications for this issue due to a general preference to avoid taking medications unless absolutely necessary.      He has a past medical history of Adenomatous colon polyp, Alcohol dependence with other alcohol-induced  disorder (HCC) (11/24/2022), Arrhythmia (12/22/2005), Arthritis, Bilateral impacted cerumen (11/24/2022), Cerumen impaction (12/31/2022), Diverticulosis, GERD (gastroesophageal reflux disease), Hemorrhoid (12/22/1998), Hyperlipidemia, Internal and external hemorrhoids without complication, Limping (11/24/2022), Tinnitus of right ear (11/24/2022), and Tobacco abuse (11/22/2012).  Problem list overviews that were updated at today's visit: Problem  Anemia   Lab Results  Component Value Date/Time   HGB 12.2 (L) 04/08/2023 10:59 AM   HGB 12.9 (L) 03/06/2023 10:15 AM   HGB 13.8 11/24/2022 10:14 AM   HGB 14.6 05/11/2015 03:02 PM   HGB 14.3 10/04/2012 11:57 AM       Current Outpatient Medications on File Prior to Visit  Medication Sig   Camphor-Menthol-Methyl Sal (SALONPAS) 3.12-27-08 % PTCH Apply 1 Act topically daily at 6 (six) AM.   ciclopirox (LOPROX) 0.77 % cream Apply topically 2 (two) times daily.   cyanocobalamin (VITAMIN B12) 1000 MCG/ML injection Inject into the muscle.   famotidine-calcium carbonate-magnesium hydroxide (PEPCID COMPLETE) 10-800-165 MG chewable tablet Chew 1 tablet by mouth daily as needed (take these only as needed for heartburn acid symptoms).   Multiple Vitamins-Minerals (ONE DAILY FOR MEN 50+ ADVANCED) TABS Take 1 tablet by mouth daily.   multivitamin (ONE-A-DAY MEN'S)  TABS tablet Take 1 tablet by mouth daily.   thiamine (VITAMIN B1) 100 MG tablet Take 1 tablet (100 mg total) by mouth daily.   No current facility-administered medications on file prior to visit.   Medications Discontinued During This Encounter  Medication Reason   cephALEXin (KEFLEX) 500 MG capsule Completed Course   methocarbamol (ROBAXIN) 500 MG tablet Reorder   diclofenac Sodium (VOLTAREN) 1 % GEL Reorder     Objective   Physical Exam  BP 102/64 (BP Location: Left Arm, Patient Position: Sitting)   Pulse 72   Temp 98.2 F (36.8 C) (Temporal)   Ht 6\' 1"  (1.854 m)   Wt 168 lb 6.4 oz (76.4  kg)   SpO2 94%   BMI 22.22 kg/m  Wt Readings from Last 10 Encounters:  09/07/23 168 lb 6.4 oz (76.4 kg)  07/27/23 168 lb 12.8 oz (76.6 kg)  03/27/23 175 lb 3.2 oz (79.5 kg)  03/06/23 179 lb 6.4 oz (81.4 kg)  02/04/23 182 lb 12.8 oz (82.9 kg)  12/31/22 182 lb 6.4 oz (82.7 kg)  11/24/22 186 lb 6.4 oz (84.6 kg)  04/10/16 139 lb (63 kg)  04/02/16 139 lb (63 kg)  02/18/16 143 lb (64.9 kg)   Vital signs reviewed.  Nursing notes reviewed. Weight trend reviewed.  Abnormalities and Problem-Specific physical exam findings:  lip lesion unchanged, severe osteoarthritis bilateral hands with distal interphalangeal\ nodules limited function , limping gait.  Stable weight noted. General Appearance:  No acute distress appreciable.   Well-groomed, healthy-appearing male.  Well proportioned with no abnormal fat distribution.  Good muscle tone. Pulmonary:  Normal work of breathing at rest, no respiratory distress apparent. SpO2: 94 %  Musculoskeletal: All extremities are intact.  Neurological:  Awake, alert, oriented, and engaged.  No obvious focal neurological deficits or cognitive impairments.  Sensorium seems unclouded.   Speech is clear and coherent with logical content. Psychiatric:  Appropriate mood, pleasant and cooperative demeanor, thoughtful and engaged during the exam  Results   LABS Anemia workup: anemia        No results found for any visits on 09/07/23.  Lab on 06/01/2023  Component Date Value   Color, Urine 06/01/2023 YELLOW    APPearance 06/01/2023 CLEAR    Specific Gravity, Urine 06/01/2023 1.025    pH 06/01/2023 6.0    Total Protein, Urine 06/01/2023 NEGATIVE    Urine Glucose 06/01/2023 NEGATIVE    Ketones, ur 06/01/2023 NEGATIVE    Bilirubin Urine 06/01/2023 NEGATIVE    Hgb urine dipstick 06/01/2023 NEGATIVE    Urobilinogen, UA 06/01/2023 0.2    Leukocytes,Ua 06/01/2023 NEGATIVE    Nitrite 06/01/2023 NEGATIVE    WBC, UA 06/01/2023 0-2/hpf    RBC / HPF 06/01/2023 none  seen    Squamous Epithelial / HPF 06/01/2023 Rare(0-4/hpf)   Lab on 04/09/2023  Component Date Value   Fecal Occult Bld 04/09/2023 Negative   Lab on 04/08/2023  Component Date Value   Color, Urine 04/08/2023 YELLOW    APPearance 04/08/2023 CLEAR    Specific Gravity, Urine 04/08/2023 1.020    pH 04/08/2023 7.0    Total Protein, Urine 04/08/2023 NEGATIVE    Urine Glucose 04/08/2023 NEGATIVE    Ketones, ur 04/08/2023 NEGATIVE    Bilirubin Urine 04/08/2023 NEGATIVE    Hgb urine dipstick 04/08/2023 NEGATIVE    Urobilinogen, UA 04/08/2023 1.0    Leukocytes,Ua 04/08/2023 TRACE (A)    Nitrite 04/08/2023 NEGATIVE    WBC, UA 04/08/2023 3-6/hpf (A)    RBC /  HPF 04/08/2023 0-2/hpf    Mucus, UA 04/08/2023 Presence of (A)    Squamous Epithelial / HPF 04/08/2023 Rare(0-4/hpf)    WBC 04/08/2023 6.3    RBC 04/08/2023 3.74 (L)    Hemoglobin 04/08/2023 12.2 (L)    HCT 04/08/2023 35.4 (L)    MCV 04/08/2023 94.6    MCHC 04/08/2023 34.6    RDW 04/08/2023 14.1    Platelets 04/08/2023 325.0    Neutrophils Relative % 04/08/2023 48.8    Lymphocytes Relative 04/08/2023 37.9    Monocytes Relative 04/08/2023 10.1    Eosinophils Relative 04/08/2023 2.4    Basophils Relative 04/08/2023 0.8    Neutro Abs 04/08/2023 3.1    Lymphs Abs 04/08/2023 2.4    Monocytes Absolute 04/08/2023 0.6    Eosinophils Absolute 04/08/2023 0.2    Basophils Absolute 04/08/2023 0.0   Office Visit on 03/27/2023  Component Date Value   Color, Urine 03/27/2023 DARK YELLOW    APPearance 03/27/2023 CLEAR    Specific Gravity, Urine 03/27/2023 1.018    pH 03/27/2023 7.5    Glucose, UA 03/27/2023 NEGATIVE    Bilirubin Urine 03/27/2023 NEGATIVE    Ketones, ur 03/27/2023 NEGATIVE    Hgb urine dipstick 03/27/2023 NEGATIVE    Protein, ur 03/27/2023 NEGATIVE    Nitrite 03/27/2023 NEGATIVE    Leukocytes,Ua 03/27/2023 NEGATIVE    WBC, UA 03/27/2023 6-10 (A)    RBC / HPF 03/27/2023 NONE SEEN    Squamous Epithelial / HPF 03/27/2023  0-5    Bacteria, UA 03/27/2023 NONE SEEN    Hyaline Cast 03/27/2023 NONE SEEN    Note 03/27/2023    Office Visit on 03/06/2023  Component Date Value   COLOGUARD 04/11/2023 Negative    Color, Urine 03/06/2023 YELLOW    APPearance 03/06/2023 CLEAR    Specific Gravity, Urine 03/06/2023 1.015    pH 03/06/2023 6.5    Glucose, UA 03/06/2023 NEGATIVE    Bilirubin Urine 03/06/2023 NEGATIVE    Ketones, ur 03/06/2023 NEGATIVE    Hgb urine dipstick 03/06/2023 NEGATIVE    Protein, ur 03/06/2023 NEGATIVE    Nitrite 03/06/2023 NEGATIVE    Leukocytes,Ua 03/06/2023 NEGATIVE    WBC, UA 03/06/2023 NONE SEEN    RBC / HPF 03/06/2023 NONE SEEN    Squamous Epithelial / HPF 03/06/2023 0-5    Bacteria, UA 03/06/2023 NONE SEEN    Hyaline Cast 03/06/2023 NONE SEEN    Note 03/06/2023     Sodium 03/06/2023 141    Potassium 03/06/2023 4.3    Chloride 03/06/2023 107    CO2 03/06/2023 29    Glucose, Bld 03/06/2023 90    BUN 03/06/2023 11    Creatinine, Ser 03/06/2023 0.85    Total Bilirubin 03/06/2023 0.5    Alkaline Phosphatase 03/06/2023 70    AST 03/06/2023 15    ALT 03/06/2023 13    Total Protein 03/06/2023 6.3    Albumin 03/06/2023 3.9    GFR 03/06/2023 93.33    Calcium 03/06/2023 9.6    WBC 03/06/2023 6.0    RBC 03/06/2023 3.93 (L)    Platelets 03/06/2023 326.0    Hemoglobin 03/06/2023 12.9 (L)    HCT 03/06/2023 37.2 (L)    MCV 03/06/2023 94.7    MCHC 03/06/2023 34.6    RDW 03/06/2023 13.7    Amylase 03/06/2023 31    Lipase 03/06/2023 15.0   Office Visit on 11/24/2022  Component Date Value   WBC 11/24/2022 5.0    RBC 11/24/2022 4.02 (L)    Hemoglobin 11/24/2022  13.8    HCT 11/24/2022 40.5    MCV 11/24/2022 100.8 (H)    MCHC 11/24/2022 34.1    RDW 11/24/2022 14.0    Platelets 11/24/2022 265.0    Neutrophils Relative % 11/24/2022 46.2    Lymphocytes Relative 11/24/2022 35.0    Monocytes Relative 11/24/2022 15.7 (H)    Eosinophils Relative 11/24/2022 1.9    Basophils Relative  11/24/2022 1.2    Neutro Abs 11/24/2022 2.3    Lymphs Abs 11/24/2022 1.7    Monocytes Absolute 11/24/2022 0.8    Eosinophils Absolute 11/24/2022 0.1    Basophils Absolute 11/24/2022 0.1    Sodium 11/24/2022 134 (L)    Potassium 11/24/2022 5.1    Chloride 11/24/2022 98    CO2 11/24/2022 28    Glucose, Bld 11/24/2022 82    BUN 11/24/2022 6    Creatinine, Ser 11/24/2022 0.81    Total Bilirubin 11/24/2022 0.4    Alkaline Phosphatase 11/24/2022 81    AST 11/24/2022 51 (H)    ALT 11/24/2022 44    Total Protein 11/24/2022 7.0    Albumin 11/24/2022 4.5    GFR 11/24/2022 94.89    Calcium 11/24/2022 9.6    Cholesterol 11/24/2022 219 (H)    Triglycerides 11/24/2022 48.0    HDL 11/24/2022 113.20    VLDL 11/24/2022 9.6    LDL Cholesterol 11/24/2022 96    Total CHOL/HDL Ratio 11/24/2022 2    NonHDL 11/24/2022 105.94    No image results found.   No results found.  DG Chest 2 View  Result Date: 11/27/2021 CLINICAL DATA:  Persistent cough over the last 3 weeks EXAM: CHEST - 2 VIEW COMPARISON:  05/14/2015 FINDINGS: Heart size is normal. Mediastinal shadows are normal. There is mild central bronchial thickening but there is no infiltrate, collapse or effusion. Ordinary mild degenerative changes affect the spine. IMPRESSION: Possible bronchitis.  No consolidation or collapse. Electronically Signed   By: Paulina Fusi M.D.   On: 11/27/2021 10:19       Additional Info: This encounter employed real-time, collaborative documentation. The patient actively reviewed and updated their medical record on a shared screen, ensuring transparency and facilitating joint problem-solving for the problem list, overview, and plan. This approach promotes accurate, informed care. The treatment plan was discussed and reviewed in detail, including medication safety, potential side effects, and all patient questions. We confirmed understanding and comfort with the plan. Follow-up instructions were established, including  contacting the office for any concerns, returning if symptoms worsen, persist, or new symptoms develop, and precautions for potential emergency department visits.

## 2023-09-07 NOTE — Assessment & Plan Note (Signed)
His nail fungus is improving with over-the-counter treatment. He should continue his current treatment regimen.

## 2023-09-08 ENCOUNTER — Encounter: Payer: Self-pay | Admitting: Internal Medicine

## 2023-09-08 DIAGNOSIS — K130 Diseases of lips: Secondary | ICD-10-CM

## 2023-09-08 HISTORY — DX: Diseases of lips: K13.0

## 2023-09-08 LAB — TSH RFX ON ABNORMAL TO FREE T4: TSH: 1.95 u[IU]/mL (ref 0.450–4.500)

## 2023-09-08 LAB — PATHOLOGIST SMEAR REVIEW

## 2023-09-08 LAB — CBC
HCT: 38.4 % — ABNORMAL LOW (ref 38.5–50.0)
Hemoglobin: 13.1 g/dL — ABNORMAL LOW (ref 13.2–17.1)
MCH: 32.1 pg (ref 27.0–33.0)
MCHC: 34.1 g/dL (ref 32.0–36.0)
MCV: 94.1 fL (ref 80.0–100.0)
MPV: 9.6 fL (ref 7.5–12.5)
Platelets: 318 10*3/uL (ref 140–400)
RBC: 4.08 10*6/uL — ABNORMAL LOW (ref 4.20–5.80)
RDW: 12.9 % (ref 11.0–15.0)
WBC: 6.6 10*3/uL (ref 3.8–10.8)

## 2023-09-08 NOTE — Progress Notes (Signed)
Subject: Follow-Up on Recent Lab Results - Action Required  If the patient has already been informed of these results, please disregard this message and close the task. Otherwise, please proceed with the following:  Mail or call to inform:  **Test Results:** - CBC shows mild anemia (Hemoglobin 13.1 g/dL, slightly below normal range) - Other labs (iron studies, B12, folate, inflammatory markers) are within normal limits  **Clinical Plan:** - No immediate action required for anemia; monitoring plan in place - Continue with existing referrals (orthopedics, physical therapy) - Maintain current medication regimen  **Instructions for Nurse:** 1. Contact the patient to inform them of the lab results and follow-up plan. 2. Key points to communicate:    - Mild anemia detected, but no immediate concerns    - Current treatment plans for other conditions remain unchanged    - Stress importance of attending scheduled appointments (orthopedics, physical therapy, dermatology) 3. Schedule a follow-up appointment for mid-December (3 months from now) for an in-person visit with Dr. Jon Billings. 4. Mail a copy of the lab results to the patient's home address. 5. Document the conversation in the patient's chart and notify Dr. Jon Billings of any concerns raised by the patient.  Dr. [facility] [phone]

## 2023-09-09 LAB — DIRECT ANTIGLOBULIN TEST (NOT AT ARMC): DAT, Polyspecific: NEGATIVE

## 2023-09-09 LAB — LACTATE DEHYDROGENASE: LDH: 121 U/L (ref 120–250)

## 2023-09-09 LAB — HAPTOGLOBIN: Haptoglobin: 203 mg/dL (ref 43–212)

## 2023-09-25 ENCOUNTER — Other Ambulatory Visit: Payer: Medicaid Other

## 2023-10-13 ENCOUNTER — Ambulatory Visit: Payer: Medicaid Other | Admitting: Dermatology

## 2023-10-21 ENCOUNTER — Ambulatory Visit (HOSPITAL_BASED_OUTPATIENT_CLINIC_OR_DEPARTMENT_OTHER): Payer: Medicaid Other | Attending: Internal Medicine | Admitting: Physical Therapy

## 2023-10-26 ENCOUNTER — Ambulatory Visit (HOSPITAL_BASED_OUTPATIENT_CLINIC_OR_DEPARTMENT_OTHER): Payer: Medicaid Other | Admitting: Cardiology

## 2023-10-28 ENCOUNTER — Other Ambulatory Visit: Payer: Medicaid Other

## 2023-11-09 ENCOUNTER — Encounter: Payer: Self-pay | Admitting: Internal Medicine

## 2023-11-09 ENCOUNTER — Ambulatory Visit: Payer: Medicaid Other | Admitting: Internal Medicine

## 2023-11-09 VITALS — BP 100/68 | HR 73 | Temp 97.9°F | Ht 73.0 in | Wt 168.0 lb

## 2023-11-09 DIAGNOSIS — K130 Diseases of lips: Secondary | ICD-10-CM | POA: Diagnosis not present

## 2023-11-09 DIAGNOSIS — Z860100 Personal history of colon polyps, unspecified: Secondary | ICD-10-CM | POA: Diagnosis not present

## 2023-11-09 DIAGNOSIS — G8929 Other chronic pain: Secondary | ICD-10-CM

## 2023-11-09 DIAGNOSIS — R2689 Other abnormalities of gait and mobility: Secondary | ICD-10-CM

## 2023-11-09 DIAGNOSIS — N62 Hypertrophy of breast: Secondary | ICD-10-CM

## 2023-11-09 DIAGNOSIS — R1011 Right upper quadrant pain: Secondary | ICD-10-CM

## 2023-11-09 DIAGNOSIS — R109 Unspecified abdominal pain: Secondary | ICD-10-CM

## 2023-11-09 DIAGNOSIS — R002 Palpitations: Secondary | ICD-10-CM

## 2023-11-09 DIAGNOSIS — Z77128 Contact with and (suspected) exposure to other hazards in the physical environment: Secondary | ICD-10-CM | POA: Insufficient documentation

## 2023-11-09 DIAGNOSIS — K7031 Alcoholic cirrhosis of liver with ascites: Secondary | ICD-10-CM

## 2023-11-09 DIAGNOSIS — K219 Gastro-esophageal reflux disease without esophagitis: Secondary | ICD-10-CM

## 2023-11-09 DIAGNOSIS — M5441 Lumbago with sciatica, right side: Secondary | ICD-10-CM

## 2023-11-09 MED ORDER — METHOCARBAMOL 500 MG PO TABS: 500.0000 mg | ORAL_TABLET | Freq: Four times a day (QID) | ORAL | 1 refills | Status: AC | PRN

## 2023-11-09 MED ORDER — OMEPRAZOLE 10 MG PO CPDR
10.0000 mg | DELAYED_RELEASE_CAPSULE | Freq: Every day | ORAL | 3 refills | Status: DC
Start: 2023-11-09 — End: 2024-05-09

## 2023-11-09 NOTE — Assessment & Plan Note (Signed)
Chronic Low Back Pain (Start Date: ~2022 )  Current status: Stable with intermittent symptoms Contributing factors:  Family history of back problems Prolonged sitting   Current management:  Methocarbamol PRN Conservative measures   Latest data:  Right-sided pain with radiation Occasional limping   Monitoring plan:  Assess pain levels and functional status Monitor for neurological symptoms

## 2023-11-09 NOTE — Progress Notes (Signed)
[facility]: [phone]   -- Medical Office Visit --  Patient:  Derek Bullock      Age: 62 y.o.       Sex:  male  Date:   11/09/2023 Today's Healthcare Provider: Lula Olszewski, MD  ==========================================================================     Assessment & Plan Lip lesion Rereferred, sent to ENT this time, he missed dermatology due to didn't have co-pay money Non-healing Lip Ulcer (ICD-10: L51.9) Assessment: Chronic non-healing lip ulcer highly suspicious for malignancy requiring urgent evaluation and treatment. Early intervention critical for optimal outcomes. Plan:   Urgent ENT referral for excision Care coordination to minimize insurance copay Recommend completion before year-end for insurance coverage optimization Office to follow up if no ENT contact within one week RUQ pain Encouraged patient to to keep planned ultrasound also has CT lung. Gynecomastia, male Stable since stopping etoh History of colon polyps Will do q3y cologuard Palpitations Was going to see cardiology but it got rescheduled Chronic right-sided low back pain with right-sided sciatica Chronic Low Back Pain with Radiculopathy (ICD-10: M54.16) Assessment: Chronic right-sided low back pain with radiation to right leg, consistent with radiculopathy. Family history significant for back problems. Symptoms stable with current management. Plan:   Continue methocarbamol as needed for pain Occasional ibuprofen use discussed with liver precautions Conservative management preferred given current financial priorities Defer imaging studies at this time Monitor for progression of symptoms Limping Didn't see physical therapy He feels its improved with B12 Feels its calmed down  Exposure to environmental toxic substances Environmental Toxin Exposure - [place] [employer] (ICD-10: Z57.31) Assessment: Historical exposure to environmental toxins requiring ongoing  monitoring. Plan:   Documentation updated for coverage purposes Monitor for related symptoms Consider additional screening as indicated Flank pain  Alcoholic cirrhosis of liver with ascites (HCC) Liver Disease with Fibrosis (ICD-10: K74.0) Assessment: Stable compensated liver disease with successful alcohol cessation for one year. No current symptoms or pain reported. Plan:   Continue alcohol abstinence Monitor liver function Minimize hepatotoxic medication exposure Continue current vitamin supplementation Gastroesophageal reflux disease without esophagitis Gastroesophageal Reflux Disease (ICD-10: K21.9) Assessment: Well-controlled on reduced dose omeprazole without breakthrough symptoms. Plan:   Decrease omeprazole to 10mg  daily Use Pepto-Bismol PRN for breakthrough symptoms Monitor for symptom recurrence Continue efforts to minimize PPI exposure Preventive Care Assessment: Due for influenza vaccination, never previously received. Plan:   Administer influenza vaccine today Continue current health maintenance measures Recent negative Cologuard noted Diagnoses and all orders for this visit: Lip lesion -     Ambulatory referral to ENT -     omeprazole (PRILOSEC) 10 MG capsule; Take 1 capsule (10 mg total) by mouth daily. RUQ pain Gynecomastia, male History of colon polyps Palpitations Chronic right-sided low back pain with right-sided sciatica Limping Exposure to environmental toxic substances Flank pain -     omeprazole (PRILOSEC) 10 MG capsule; Take 1 capsule (10 mg total) by mouth daily. -     methocarbamol (ROBAXIN) 500 MG tablet; Take 1 tablet (500 mg total) by mouth every 6 (six) hours as needed for muscle spasms. Muscle relaxer for back pain  Recommended follow-up: Follow-up: Return visit in 3 months with interim follow-up for ENT coordination. Future Appointments  Date Time Provider Department Center  11/12/2023  1:15 PM Gwenith Daily, MD [facility] None   02/09/2024  9:20 AM Lula Olszewski, MD [facility]  Patient Care Team: Lula Olszewski, MD as PCP - General (Internal Medicine) Duke Salvia, MD as Consulting Physician (Cardiology)  SUBJECTIVE: 62 y.o. male who has Gynecomastia, male; History of colon polyps; Palpitations; Limping; Cirrhosis of liver (HCC); Distal interphalangeal nodule; History of smoking 25-50 pack years; History of pneumonia; GERD (gastroesophageal reflux disease); Hyperlipidemia, acquired; RUQ pain; Flank pain; Anemia; Onychomycosis; Hand arthritis; Lip lesion; and Exposure to environmental toxic substances on their problem list.. Main reasons for visit/main concerns/chief complaint: 2 month follow-up   ------------------------------------------------------------------------------------------------------------------------ AI-Extracted: Discussed the use of AI scribe software for clinical note transcription with the patient, who gave verbal consent to proceed.  History of Present Illness  The patient presents for a two-month follow-up with multiple concerns, primarily focused on a non-healing lip ulcer that has persisted due to financial constraints limiting specialist evaluation. The patient expresses willingness to proceed with evaluation given the clinical urgency communicated today.  The patient reports chronic right flank and lower back pain of two-year duration with radiation to the right leg, occasionally causing a limp. The pain pattern is characterized by stiffness after prolonged sitting. Pain management has consisted of as-needed methocarbamol with stable frequency and intensity. The patient endorses a strong family history of back problems.  Regarding gastrointestinal health, the patient reports well-controlled heartburn symptoms on reduced-dose omeprazole 20mg  daily with no breakthrough symptoms. The patient takes daily B12 supplementation with subjective improvement in walking ability.  The patient's  liver disease remains stable with continued alcohol abstinence for one year. Notably, the patient achieved sobriety without requiring withdrawal medications and denies any current liver pain. Additional history includes exposure to environmental toxins at [facility], which has been documented for ongoing monitoring.  Preventive care discussion reveals no prior influenza vaccination history, though the patient expresses openness to receiving the vaccine today after discussion of benefits and risks. Recent preventive screening includes a negative Cologuard test within the past year.  Review of Systems yields pertinent positives for musculoskeletal (back pain, limping) and gastrointestinal (history of heartburn). All other systems reviewed and negative including constitutional (denies fever, weight changes), respiratory (denies cough, shortness of breath), and cardiovascular (stable occasional palpitations).  Problem list overviews that were updated at today's visit: Problem  Exposure to Environmental Toxic Substances  Lip Lesion   Lesion of lip (ICD-10: R23.8) - New: Suspicious lesion on lip noted. Dermatology appointment scheduled for late October for evaluation of possible skin cancer. Patient advised to monitor for changes and seek earlier evaluation if rapid growth occurs.   Ruq Pain   Has been improving  Does wrap around to kidney area Came and gone off/on since 2022 He reports it does NOT associated with constipation, diarrhea, nausea or any other symptom(s)    History of Colon Polyps   Negative Cologuard 2024  Plans to see gastrointestinal soon for repeat.  Path from 2017 colonoscopy 1. Surgical [P], ascending and cecum, polyps(3) - TUBULAR ADENOMAS. - NO HIGH GRADE DYSPLASIA OR MALIGNANCY IDENTIFIED. 2. Surgical [P], transverse, polyps(2) - TUBULAR ADENOMA AND HYPERPLASTIC COLONIC POLYP. - NO HIGH GRADE DYSPLASIA OR MALIGNANCY IDENTIFIED. 3. Surgical [P], rectum, sigmoid and  descending, recto sigmoid, polyps(5) - TUBULAR ADENOMA AND HYPERPLASTIC COLONIC POLYPS. - NO HIGH GRADE DYSPLASIA OR MALIGNANCY IDENTIFIED.   Palpitations   Interim history these haven't been bothering him much lately  Prior history :  has intermittent palpitations and 2012 evaluation by cardiologist Hurman Horn documented the tachybradycardia and possibly doing loop recorder versus seeing electrophysiology but he has not followed up in about 12 yea   Limping   Abnormal gait (ICD-10: R26.89) - Updated: Chronic limping gait with associated knee  and hip pain. Etiology remains unclear. Physical therapy referral in place for comprehensive evaluation and treatment plan.  Interim history: plans to see rehab soon, taking B12 as advised  Keeps left leg straight when walking- says the problem is in the hip though the knee doesn't bend and there is no pain in either.   Gynecomastia, Male   Suspicious liver disease induced Has discontinued all alcohol at beginning of 2024     Med reconciliation: Current Outpatient Medications on File Prior to Visit  Medication Sig   Camphor-Menthol-Methyl Sal (SALONPAS) 3.12-27-08 % PTCH Apply 1 Act topically daily at 6 (six) AM.   ciclopirox (LOPROX) 0.77 % cream Apply topically 2 (two) times daily.   cyanocobalamin (VITAMIN B12) 1000 MCG/ML injection Inject into the muscle.   diclofenac Sodium (VOLTAREN) 1 % GEL Apply 4 g topically 4 (four) times daily as needed.   famotidine-calcium carbonate-magnesium hydroxide (PEPCID COMPLETE) 10-800-165 MG chewable tablet Chew 1 tablet by mouth daily as needed (take these only as needed for heartburn acid symptoms).   Multiple Vitamins-Minerals (ONE DAILY FOR MEN 50+ ADVANCED) TABS Take 1 tablet by mouth daily.   multivitamin (ONE-A-DAY MEN'S) TABS tablet Take 1 tablet by mouth daily.   thiamine (VITAMIN B1) 100 MG tablet Take 1 tablet (100 mg total) by mouth daily.   No current facility-administered medications on file  prior to visit.   Medications Discontinued During This Encounter  Medication Reason   omeprazole (PRILOSEC) 20 MG capsule Expired Prescription   methocarbamol (ROBAXIN) 500 MG tablet Reorder     Objective   Physical Exam     11/09/2023   10:16 AM 09/07/2023    9:14 AM 07/27/2023    9:15 AM  Vitals with BMI  Height 6\' 1"  6\' 1"  6\' 1"   Weight 168 lbs 168 lbs 6 oz 168 lbs 13 oz  BMI 22.17 22.22 22.28  Systolic 100 102 161  Diastolic 68 64 73  Pulse 73 72 62   Wt Readings from Last 10 Encounters:  11/09/23 168 lb (76.2 kg)  09/07/23 168 lb 6.4 oz (76.4 kg)  07/27/23 168 lb 12.8 oz (76.6 kg)  03/27/23 175 lb 3.2 oz (79.5 kg)  03/06/23 179 lb 6.4 oz (81.4 kg)  02/04/23 182 lb 12.8 oz (82.9 kg)  12/31/22 182 lb 6.4 oz (82.7 kg)  11/24/22 186 lb 6.4 oz (84.6 kg)  04/10/16 139 lb (63 kg)  04/02/16 139 lb (63 kg)   Vital signs reviewed.  Nursing notes reviewed. Weight trend reviewed. Abnormalities and Problem-Specific physical exam findings:  lip ulcer not healing, similar appearing to prior.  General Appearance:  No acute distress appreciable.   Well-groomed, healthy-appearing male.  Well proportioned with no abnormal fat distribution.  Good muscle tone. Pulmonary:  Normal work of breathing at rest, no respiratory distress apparent. SpO2: 97 %  Musculoskeletal: All extremities are intact.  Neurological:  Awake, alert, oriented, and engaged.  No obvious focal neurological deficits or cognitive impairments.  Sensorium seems unclouded.   Speech is clear and coherent with logical content. Psychiatric:  Appropriate mood, pleasant and cooperative demeanor, thoughtful and engaged during the exam  Results   LABS Glucose: 82 Glucose: 90  DIAGNOSTIC Cologuard: Negative        No results found for any visits on 11/09/23.  Office Visit on 09/07/2023  Component Date Value   CRP 09/07/2023 <1.0    Sed Rate 09/07/2023 5    Folate 09/07/2023 >24.2    Vitamin B-12 09/07/2023 404  Ferritin 09/07/2023 34.4    DAT, Polyspecific 09/07/2023 NEGATIVE    Haptoglobin 09/07/2023 203    LDH 09/07/2023 121    TSH 09/07/2023 1.950    WBC 09/07/2023 6.6    RBC 09/07/2023 4.08 (L)    Hemoglobin 09/07/2023 13.1 (L)    HCT 09/07/2023 38.4 (L)    MCV 09/07/2023 94.1    MCH 09/07/2023 32.1    MCHC 09/07/2023 34.1    RDW 09/07/2023 12.9    Platelets 09/07/2023 318    MPV 09/07/2023 9.6    Path Review 09/07/2023     Iron 09/07/2023 68    Transferrin 09/07/2023 226.0    Saturation Ratios 09/07/2023 21.5    Ferritin 09/07/2023 34.4    TIBC 09/07/2023 316.4    Retic Ct Pct 09/07/2023 1.0    ABS Retic 09/07/2023 40,600    Path Review 09/07/2023    Lab on 06/01/2023  Component Date Value   Color, Urine 06/01/2023 YELLOW    APPearance 06/01/2023 CLEAR    Specific Gravity, Urine 06/01/2023 1.025    pH 06/01/2023 6.0    Total Protein, Urine 06/01/2023 NEGATIVE    Urine Glucose 06/01/2023 NEGATIVE    Ketones, ur 06/01/2023 NEGATIVE    Bilirubin Urine 06/01/2023 NEGATIVE    Hgb urine dipstick 06/01/2023 NEGATIVE    Urobilinogen, UA 06/01/2023 0.2    Leukocytes,Ua 06/01/2023 NEGATIVE    Nitrite 06/01/2023 NEGATIVE    WBC, UA 06/01/2023 0-2/hpf    RBC / HPF 06/01/2023 none seen    Squamous Epithelial / HPF 06/01/2023 Rare(0-4/hpf)   Lab on 04/09/2023  Component Date Value   Fecal Occult Bld 04/09/2023 Negative   Lab on 04/08/2023  Component Date Value   Color, Urine 04/08/2023 YELLOW    APPearance 04/08/2023 CLEAR    Specific Gravity, Urine 04/08/2023 1.020    pH 04/08/2023 7.0    Total Protein, Urine 04/08/2023 NEGATIVE    Urine Glucose 04/08/2023 NEGATIVE    Ketones, ur 04/08/2023 NEGATIVE    Bilirubin Urine 04/08/2023 NEGATIVE    Hgb urine dipstick 04/08/2023 NEGATIVE    Urobilinogen, UA 04/08/2023 1.0    Leukocytes,Ua 04/08/2023 TRACE (A)    Nitrite 04/08/2023 NEGATIVE    WBC, UA 04/08/2023 3-6/hpf (A)    RBC / HPF 04/08/2023 0-2/hpf    Mucus, UA 04/08/2023  Presence of (A)    Squamous Epithelial / HPF 04/08/2023 Rare(0-4/hpf)    WBC 04/08/2023 6.3    RBC 04/08/2023 3.74 (L)    Hemoglobin 04/08/2023 12.2 (L)    HCT 04/08/2023 35.4 (L)    MCV 04/08/2023 94.6    MCHC 04/08/2023 34.6    RDW 04/08/2023 14.1    Platelets 04/08/2023 325.0    Neutrophils Relative % 04/08/2023 48.8    Lymphocytes Relative 04/08/2023 37.9    Monocytes Relative 04/08/2023 10.1    Eosinophils Relative 04/08/2023 2.4    Basophils Relative 04/08/2023 0.8    Neutro Abs 04/08/2023 3.1    Lymphs Abs 04/08/2023 2.4    Monocytes Absolute 04/08/2023 0.6    Eosinophils Absolute 04/08/2023 0.2    Basophils Absolute 04/08/2023 0.0   Office Visit on 03/27/2023  Component Date Value   Color, Urine 03/27/2023 DARK YELLOW    APPearance 03/27/2023 CLEAR    Specific Gravity, Urine 03/27/2023 1.018    pH 03/27/2023 7.5    Glucose, UA 03/27/2023 NEGATIVE    Bilirubin Urine 03/27/2023 NEGATIVE    Ketones, ur 03/27/2023 NEGATIVE    Hgb urine dipstick 03/27/2023 NEGATIVE  Protein, ur 03/27/2023 NEGATIVE    Nitrite 03/27/2023 NEGATIVE    Leukocytes,Ua 03/27/2023 NEGATIVE    WBC, UA 03/27/2023 6-10 (A)    RBC / HPF 03/27/2023 NONE SEEN    Squamous Epithelial / HPF 03/27/2023 0-5    Bacteria, UA 03/27/2023 NONE SEEN    Hyaline Cast 03/27/2023 NONE SEEN    Note 03/27/2023    Office Visit on 03/06/2023  Component Date Value   COLOGUARD 04/11/2023 Negative    Color, Urine 03/06/2023 YELLOW    APPearance 03/06/2023 CLEAR    Specific Gravity, Urine 03/06/2023 1.015    pH 03/06/2023 6.5    Glucose, UA 03/06/2023 NEGATIVE    Bilirubin Urine 03/06/2023 NEGATIVE    Ketones, ur 03/06/2023 NEGATIVE    Hgb urine dipstick 03/06/2023 NEGATIVE    Protein, ur 03/06/2023 NEGATIVE    Nitrite 03/06/2023 NEGATIVE    Leukocytes,Ua 03/06/2023 NEGATIVE    WBC, UA 03/06/2023 NONE SEEN    RBC / HPF 03/06/2023 NONE SEEN    Squamous Epithelial / HPF 03/06/2023 0-5    Bacteria, UA 03/06/2023  NONE SEEN    Hyaline Cast 03/06/2023 NONE SEEN    Note 03/06/2023     Sodium 03/06/2023 141    Potassium 03/06/2023 4.3    Chloride 03/06/2023 107    CO2 03/06/2023 29    Glucose, Bld 03/06/2023 90    BUN 03/06/2023 11    Creatinine, Ser 03/06/2023 0.85    Total Bilirubin 03/06/2023 0.5    Alkaline Phosphatase 03/06/2023 70    AST 03/06/2023 15    ALT 03/06/2023 13    Total Protein 03/06/2023 6.3    Albumin 03/06/2023 3.9    GFR 03/06/2023 93.33    Calcium 03/06/2023 9.6    WBC 03/06/2023 6.0    RBC 03/06/2023 3.93 (L)    Platelets 03/06/2023 326.0    Hemoglobin 03/06/2023 12.9 (L)    HCT 03/06/2023 37.2 (L)    MCV 03/06/2023 94.7    MCHC 03/06/2023 34.6    RDW 03/06/2023 13.7    Amylase 03/06/2023 31    Lipase 03/06/2023 15.0   Office Visit on 11/24/2022  Component Date Value   WBC 11/24/2022 5.0    RBC 11/24/2022 4.02 (L)    Hemoglobin 11/24/2022 13.8    HCT 11/24/2022 40.5    MCV 11/24/2022 100.8 (H)    MCHC 11/24/2022 34.1    RDW 11/24/2022 14.0    Platelets 11/24/2022 265.0    Neutrophils Relative % 11/24/2022 46.2    Lymphocytes Relative 11/24/2022 35.0    Monocytes Relative 11/24/2022 15.7 (H)    Eosinophils Relative 11/24/2022 1.9    Basophils Relative 11/24/2022 1.2    Neutro Abs 11/24/2022 2.3    Lymphs Abs 11/24/2022 1.7    Monocytes Absolute 11/24/2022 0.8    Eosinophils Absolute 11/24/2022 0.1    Basophils Absolute 11/24/2022 0.1    Sodium 11/24/2022 134 (L)    Potassium 11/24/2022 5.1    Chloride 11/24/2022 98    CO2 11/24/2022 28    Glucose, Bld 11/24/2022 82    BUN 11/24/2022 6    Creatinine, Ser 11/24/2022 0.81    Total Bilirubin 11/24/2022 0.4    Alkaline Phosphatase 11/24/2022 81    AST 11/24/2022 51 (H)    ALT 11/24/2022 44    Total Protein 11/24/2022 7.0    Albumin 11/24/2022 4.5    GFR 11/24/2022 94.89    Calcium 11/24/2022 9.6    Cholesterol 11/24/2022 219 (H)    Triglycerides  11/24/2022 48.0    HDL 11/24/2022 113.20    VLDL  11/24/2022 9.6    LDL Cholesterol 11/24/2022 96    Total CHOL/HDL Ratio 11/24/2022 2    NonHDL 11/24/2022 105.94    No image results found.   No results found.  DG Chest 2 View  Result Date: 11/27/2021 CLINICAL DATA:  Persistent cough over the last 3 weeks EXAM: CHEST - 2 VIEW COMPARISON:  05/14/2015 FINDINGS: Heart size is normal. Mediastinal shadows are normal. There is mild central bronchial thickening but there is no infiltrate, collapse or effusion. Ordinary mild degenerative changes affect the spine. IMPRESSION: Possible bronchitis.  No consolidation or collapse. Electronically Signed   By: Paulina Fusi M.D.   On: 11/27/2021 10:19         Additional Info: This encounter employed real-time, collaborative documentation. The patient actively reviewed and updated their medical record on a shared screen, ensuring transparency and facilitating joint problem-solving for the problem list, overview, and plan. This approach promotes accurate, informed care. The treatment plan was discussed and reviewed in detail, including medication safety, potential side effects, and all patient questions. We confirmed understanding and comfort with the plan. Follow-up instructions were established, including contacting the office for any concerns, returning if symptoms worsen, persist, or new symptoms develop, and precautions for potential emergency department visits.

## 2023-11-09 NOTE — Assessment & Plan Note (Signed)
Liver Disease with Fibrosis (ICD-10: K74.0) Assessment: Stable compensated liver disease with successful alcohol cessation for one year. No current symptoms or pain reported. Plan:   Continue alcohol abstinence Monitor liver function Minimize hepatotoxic medication exposure Continue current vitamin supplementation

## 2023-11-09 NOTE — Assessment & Plan Note (Addendum)
Will do q3y cologuard

## 2023-11-09 NOTE — Assessment & Plan Note (Addendum)
Stable since stopping etoh

## 2023-11-09 NOTE — Assessment & Plan Note (Signed)
Chronic Low Back Pain with Radiculopathy (ICD-10: M54.16) Assessment: Chronic right-sided low back pain with radiation to right leg, consistent with radiculopathy. Family history significant for back problems. Symptoms stable with current management. Plan:   Continue methocarbamol as needed for pain Occasional ibuprofen use discussed with liver precautions Conservative management preferred given current financial priorities Defer imaging studies at this time Monitor for progression of symptoms

## 2023-11-09 NOTE — Assessment & Plan Note (Addendum)
Was going to see cardiology but it got rescheduled

## 2023-11-09 NOTE — Assessment & Plan Note (Signed)
Gastroesophageal Reflux Disease (ICD-10: K21.9) Assessment: Well-controlled on reduced dose omeprazole without breakthrough symptoms. Plan:   Decrease omeprazole to 10mg  daily Use Pepto-Bismol PRN for breakthrough symptoms Monitor for symptom recurrence Continue efforts to minimize PPI exposure

## 2023-11-09 NOTE — Patient Instructions (Signed)
After Visit Summary  Thank you for your visit today. Below is a summary of what we discussed and your care plan.  MOST IMPORTANT ACTIONS:  Watch for a call from the Ear, Nose, and Throat (ENT) specialist office this week  Call our office by the end of this week if you haven't heard from the ENT  Schedule your ENT appointment before year-end to maximize insurance coverage  Return to our office in 3 months  TODAY'S KEY UPDATES:  1. Lip Concern     An ENT specialist will contact you soon about removing the spot on your lip     Early removal is important to prevent more extensive surgery later     We are working to help minimize your copay costs     This is your highest priority health task right now  2. Back and Flank Pain     Continue using methocarbamol (muscle relaxer) as needed     You may use ibuprofen occasionally for severe pain     Use heat or cold packs for comfort     Your symptoms suggest this is coming from your back, not your kidneys     Call if you notice any new or worsening symptoms  3. Heartburn Management     Reducing your omeprazole to 10mg  daily     Use Pepto-Bismol if you get breakthrough heartburn     Don't increase omeprazole dose without calling us     Avoid lying down for 3 hours after eating  4. Liver Health     Your liver condition remains stable     Continue to avoid alcohol completely     Keep taking your vitamin B12 supplement     Great job maintaining sobriety for the past year  5. Preventive Care     Received flu vaccine today     Recent Cologuard test was negative     Continue current vitamin supplements  MEDICATION CHANGES:  Omeprazole: Decreasing to 10mg  daily  Methocarbamol: Refilled for back pain, use as needed  Continue B12 supplement daily  LIFESTYLE RECOMMENDATIONS:  Maintain alcohol abstinence  Use back-friendly positions when sitting for long periods  Get up and move around every 1-2 hours when sitting  Avoid foods that trigger  heartburn, especially before bedtime  WHEN TO CALL us:  If you don't hear from the ENT office by the end of this week  If the spot on your lip changes or grows  If you develop severe back pain or new symptoms  If heartburn becomes frequent despite using Pepto-Bismol  Any new or concerning symptoms  FOLLOW-UP APPOINTMENTS: 1. ENT Specialist: Await their call this week 2. Return to our office: 3 months 3. Call sooner if needed  Remember: The lip lesion is our top priority. Please make sure to schedule that appointment as soon as the ENT office calls. We're here to help if you have any questions or concerns.

## 2023-11-09 NOTE — Assessment & Plan Note (Addendum)
Didn't see physical therapy He feels its improved with B12 Feels its calmed down

## 2023-11-09 NOTE — Assessment & Plan Note (Addendum)
Encouraged patient to to keep planned ultrasound also has CT lung.

## 2023-11-09 NOTE — Assessment & Plan Note (Addendum)
Rereferred, sent to ENT this time, he missed dermatology due to didn't have co-pay money Non-healing Lip Ulcer (ICD-10: L51.9) Assessment: Chronic non-healing lip ulcer highly suspicious for malignancy requiring urgent evaluation and treatment. Early intervention critical for optimal outcomes. Plan:   Urgent ENT referral for excision Care coordination to minimize insurance copay Recommend completion before year-end for insurance coverage optimization Office to follow up if no ENT contact within one week

## 2023-11-09 NOTE — Assessment & Plan Note (Signed)
Environmental Toxin Exposure - [employer] (ICD-10: Z57.31) Assessment: Historical exposure to environmental toxins requiring ongoing monitoring. Plan:   Documentation updated for coverage purposes Monitor for related symptoms Consider additional screening as indicated

## 2023-11-12 ENCOUNTER — Ambulatory Visit: Payer: Medicaid Other | Admitting: Dermatology

## 2023-11-22 ENCOUNTER — Other Ambulatory Visit: Payer: Self-pay | Admitting: Internal Medicine

## 2023-11-22 DIAGNOSIS — C009 Malignant neoplasm of lip, unspecified: Secondary | ICD-10-CM | POA: Insufficient documentation

## 2023-12-01 ENCOUNTER — Encounter: Payer: Self-pay | Admitting: Internal Medicine

## 2023-12-22 ENCOUNTER — Ambulatory Visit: Payer: Medicaid Other | Admitting: Dermatology

## 2023-12-22 ENCOUNTER — Encounter: Payer: Self-pay | Admitting: Dermatology

## 2023-12-22 VITALS — BP 112/68 | HR 88

## 2023-12-22 DIAGNOSIS — L280 Lichen simplex chronicus: Secondary | ICD-10-CM | POA: Diagnosis not present

## 2023-12-22 DIAGNOSIS — F17210 Nicotine dependence, cigarettes, uncomplicated: Secondary | ICD-10-CM | POA: Diagnosis not present

## 2023-12-22 DIAGNOSIS — D485 Neoplasm of uncertain behavior of skin: Secondary | ICD-10-CM

## 2023-12-22 DIAGNOSIS — D492 Neoplasm of unspecified behavior of bone, soft tissue, and skin: Secondary | ICD-10-CM | POA: Diagnosis not present

## 2023-12-22 NOTE — Progress Notes (Signed)
   New Patient Visit   Subjective  Derek Bullock is a 62 y.o. male who presents for the following: growth on inner upper lip. Lesion on his right lower mucosal lip has been there about 5 months. He has tried using Neosporin. He was sent over by ENT to biopsy the lesion.   The patient has spots, moles and lesions to be evaluated, some may be new or changing. Pt has no hx of skin cancer. He does smoke cigarettes but denies chewing tobacco use  The following portions of the chart were reviewed this encounter and updated as appropriate: medications, allergies, medical history  Review of Systems:  No other skin or systemic complaints except as noted in HPI or Assessment and Plan.  Objective  Well appearing patient in no apparent distress; mood and affect are within normal limits.  A focused examination was performed of the following areas: lip  Relevant exam findings are noted in the Assessment and Plan.  Mid Upper Vermilion Lip 6 mm fissured papule   Assessment & Plan    NEOPLASM OF UNCERTAIN BEHAVIOR OF SKIN Mid Upper Vermilion Lip Skin / nail biopsy Type of biopsy: tangential   Informed consent: discussed and consent obtained   Timeout: patient name, date of birth, surgical site, and procedure verified   Procedure prep:  Patient was prepped and draped in usual sterile fashion Prep type:  Isopropyl alcohol Anesthesia: the lesion was anesthetized in a standard fashion   Anesthetic:  1% lidocaine w/ epinephrine 1-100,000 buffered w/ 8.4% NaHCO3 Instrument used: DermaBlade   Hemostasis achieved with: aluminum chloride and electrodesiccation   Outcome: patient tolerated procedure well   Post-procedure details: sterile dressing applied and wound care instructions given   Dressing type: petrolatum    Return for based on biopsy results.  I, Darice Smock, CMA, am acting as scribe for RUFUS CHRISTELLA HOLY, MD.   Documentation: I have reviewed the above documentation for  accuracy and completeness, and I agree with the above.  RUFUS CHRISTELLA HOLY, MD

## 2023-12-22 NOTE — Patient Instructions (Signed)

## 2023-12-24 ENCOUNTER — Other Ambulatory Visit: Payer: Medicaid Other

## 2023-12-24 ENCOUNTER — Inpatient Hospital Stay: Admission: RE | Admit: 2023-12-24 | Payer: Medicaid Other | Source: Ambulatory Visit

## 2023-12-25 LAB — SURGICAL PATHOLOGY

## 2023-12-29 ENCOUNTER — Other Ambulatory Visit: Payer: Self-pay | Admitting: Dermatology

## 2023-12-29 ENCOUNTER — Telehealth: Payer: Self-pay

## 2023-12-29 DIAGNOSIS — L280 Lichen simplex chronicus: Secondary | ICD-10-CM

## 2023-12-29 MED ORDER — HYDROCORTISONE 2.5 % EX OINT
TOPICAL_OINTMENT | Freq: Two times a day (BID) | CUTANEOUS | 0 refills | Status: DC
Start: 2023-12-29 — End: 2024-08-08

## 2023-12-29 NOTE — Telephone Encounter (Signed)
-----   Message from [facility] sent at 12/29/2023 10:31 AM EST ----- Lip biopsy showed lichenoid dermatitis, which is not a skin cancer, will give him hydrocortisone  ointment to use on lesion twice daily for 3 weeks. Can return in 4 weeks for follow up. Will send RX to pharmacy

## 2023-12-29 NOTE — Telephone Encounter (Signed)
 Spoke with pt and gave him bx results and recommendations

## 2024-01-25 ENCOUNTER — Other Ambulatory Visit: Payer: Medicaid Other

## 2024-01-25 ENCOUNTER — Inpatient Hospital Stay: Admission: RE | Admit: 2024-01-25 | Payer: Medicaid Other | Source: Ambulatory Visit

## 2024-02-09 ENCOUNTER — Encounter: Payer: Self-pay | Admitting: Internal Medicine

## 2024-02-09 ENCOUNTER — Ambulatory Visit: Payer: Medicaid Other | Admitting: Internal Medicine

## 2024-02-09 VITALS — BP 120/68 | HR 87 | Temp 98.2°F | Ht 73.0 in | Wt 170.8 lb

## 2024-02-09 DIAGNOSIS — E785 Hyperlipidemia, unspecified: Secondary | ICD-10-CM

## 2024-02-09 DIAGNOSIS — R2689 Other abnormalities of gait and mobility: Secondary | ICD-10-CM | POA: Diagnosis not present

## 2024-02-09 DIAGNOSIS — K7031 Alcoholic cirrhosis of liver with ascites: Secondary | ICD-10-CM

## 2024-02-09 DIAGNOSIS — B351 Tinea unguium: Secondary | ICD-10-CM

## 2024-02-09 DIAGNOSIS — R002 Palpitations: Secondary | ICD-10-CM

## 2024-02-09 DIAGNOSIS — Z87891 Personal history of nicotine dependence: Secondary | ICD-10-CM | POA: Diagnosis not present

## 2024-02-09 DIAGNOSIS — R10A0 Flank pain, unspecified side: Secondary | ICD-10-CM

## 2024-02-09 DIAGNOSIS — R109 Unspecified abdominal pain: Secondary | ICD-10-CM

## 2024-02-09 NOTE — Assessment & Plan Note (Signed)
Smoking Cessation Continued smoking heightens heart disease risk. Quitting smoking is advised, with a recommendation to switch to nicotine-only products like Zen pouches.

## 2024-02-09 NOTE — Addendum Note (Signed)
Addended by: Lula Olszewski on: 02/09/2024 04:08 PM   Modules accepted: Orders

## 2024-02-09 NOTE — Patient Instructions (Addendum)
Remember: Shift tobacco to nicotine only products like zyn Start avocado and Extra Virgin Olive Oil every day. Get LDCT which will tell us about lungs, heart, kidneys, liver They will call about physical therapy in 2 months for the limp  VISIT SUMMARY:  Derek Bullock, a 63 year old male, visited for a three-month follow-up to manage chronic right-sided back pain. He also discussed intermittent palpitations, high cholesterol, smoking cessation, limping, a history of onychomycosis, and resolved liver cirrhosis. Dietary changes and physical therapy were recommended, and upcoming imaging studies were noted.  YOUR PLAN:  -CHRONIC RIGHT FLANK PAIN: Your intermittent right flank pain may be related to your kidneys. We will include this in the request for your upcoming CT scan, which will be scheduled at an affordable location.  -PALPITATIONS: Palpitations are sensations of your heart skipping beats or racing. Although you haven't experienced them recently, the CT scan may provide some incidental information about your heart function.  -HYPERLIPIDEMIA: Hyperlipidemia means you have high cholesterol levels, which increase your risk of heart disease. We recommend dietary changes, such as incorporating avocados and extra virgin olive oil, and will reassess your cholesterol levels after the CT scan.  -SMOKING CESSATION: Continuing to smoke increases your risk of heart disease. We recommend quitting smoking and suggest switching to nicotine-only products like Zen pouches.  -LIMPING: Your chronic limping may be due to thin legs with limited muscle mass and activity. We discussed the benefits of physical therapy, which you will start in two months, and encourage increased physical activity, including walking and light weight lifting.  -ONYCHOMYCOSIS: Onychomycosis is a fungal infection of the toenails. Your condition is mostly resolved, and no further medication is needed, but please monitor for any  recurrence.  -LIVER CIRRHOSIS (RESOLVED): Liver cirrhosis is severe liver damage often caused by alcohol use. Your condition has resolved since you quit drinking, and there is no current liver damage. It is important to continue abstaining from alcohol.  -GENERAL HEALTH MAINTENANCE: Due to your high cardiovascular risk from smoking and high cholesterol, we recommend dietary changes and smoking cessation. Whole grain oats are preferred over refined oats. We will follow up in 3-6 months, depending on your CT scan results.  INSTRUCTIONS:  You have a liver ultrasound scheduled for March 3 and a follow-up appointment for your lip on March 6. We will schedule a follow-up visit in 3-6 months, depending on the results of your CT scan.

## 2024-02-09 NOTE — Assessment & Plan Note (Signed)
Onychomycosis The fungal toenail infection is partially resolved, requiring no further medication. Monitoring for recurrence is advised.

## 2024-02-09 NOTE — Assessment & Plan Note (Signed)
Palpitations Intermittent palpitations have not occurred recently. The CT scan for lung cancer screening may provide incidental cardiac information to evaluate heart function.

## 2024-02-09 NOTE — Assessment & Plan Note (Signed)
Limping Chronic limping persists without a clear cause. Legs are thin with limited muscle mass and activity. Financial constraints delay physical therapy, but its benefits and increased activity were discussed. Physical therapy will start in two months, with encouragement for increased physical activity, including walking and light weight lifting.

## 2024-02-09 NOTE — Assessment & Plan Note (Signed)
Liver Cirrhosis (Resolved) A history of liver cirrhosis, likely due to alcohol use, has resolved after quitting alcohol. There is no current liver damage. The importance of abstaining from alcohol is reinforced, with liver function monitored as needed.

## 2024-02-09 NOTE — Assessment & Plan Note (Signed)
Hyperlipidemia Cholesterol levels are elevated, increasing heart disease risk, especially with a smoking history. Dietary modifications, including avocados and extra virgin olive oil, are advised to lower cholesterol and prevent cardiovascular events. Cholesterol levels will be reassessed after the CT scan.

## 2024-02-09 NOTE — Assessment & Plan Note (Signed)
Chronic Right Flank Pain Intermittent right flank pain may be kidney-related, though previous evaluations for kidney stones were inconclusive. Financial constraints delayed further imaging. A CT scan for lung cancer screening will also evaluate the kidney, and the pain will be documented in the request. The scan will be scheduled at an affordable location.

## 2024-02-09 NOTE — Progress Notes (Signed)
==============================  [facility]: [phone]   -- Medical Office Visit --  Patient: Derek Bullock      Age: 63 y.o.       Sex:  male  Date:   02/09/2024 Today's Healthcare Provider: Lula Olszewski, MD  ==============================   CHIEF COMPLAINT: 3 month follow-up and Medical Management of Chronic Issues  SUBJECTIVE: Background This is a 63 y.o. male who has History of colon polyps; Palpitations; Limping; Distal interphalangeal nodule; History of smoking 25-50 pack years; History of pneumonia; GERD (gastroesophageal reflux disease); Hyperlipidemia, acquired; Flank pain; Anemia; Onychomycosis; Hand arthritis; Exposure to environmental toxic substances; and Chronic right-sided low back pain with right-sided sciatica on their problem list.  History of Present Illness Derek Bullock is a 63 year old male who presents for a three-month follow-up for medical management of chronic right-sided back pain.  He experiences intermittent right-sided back pain, specifically near the right kidney area, described as being on the back of the side wall of the ribs. He recalls a previous evaluation for kidney stones, which included blood work, but is unsure of the results. Financial concerns have prevented him from undergoing x-rays of the spine, although they were ordered. He mentions a CT scan from last year for lung cancer screening that was not completed, and he has an upcoming liver ultrasound scheduled for March 3rd.  He reports occasional palpitations, described as heart skipping beats or racing, but notes that these have not occurred recently. He has not seen a cardiologist yet and recalls a previous consideration for a loop recorder but has not pursued it further.  He acknowledges limping during appointments but denies any specific injury. He attributes this to a lack of physical activity and thin legs with little muscle. He  is interested in starting physical therapy in a few months to address this issue.  He has a history of high cholesterol and smoking, which he acknowledges as risk factors for heart disease. He has quit drinking and reports weight gain since then. He discusses dietary changes, including incorporating avocados and extra virgin olive oil, to help manage his cholesterol.  He reports a history of liver cirrhosis, which he attributes to past alcohol use. He notes significant improvement in his liver health since quitting drinking, with no current symptoms of liver failure. No current breast growth or swelling under the nipples, which he associates with past liver issues.  He mentions a history of onychomycosis, which he believes is mostly resolved, and does not require further medication. He also discusses a lip lesion that was excised and tested, revealing it was not cancerous but possibly fungal. He has a follow-up appointment on March 6th.  He mentions a history of anemia, which was borderline in the past, but does not express concern about it currently. No recent reflux symptoms and takes medication for heartburn as needed.    Reviewed chart records that patient  has a past medical history of Adenomatous colon polyp, Alcohol dependence with other alcohol-induced disorder (HCC) (11/24/2022), Arrhythmia (12/22/2005), Arthritis, Bilateral impacted cerumen (11/24/2022), Cerumen impaction (12/31/2022), Cirrhosis of liver (HCC) (11/24/2022), Diverticulosis, GERD (gastroesophageal reflux disease), Gynecomastia, male (05/11/2015), Hemorrhoid (12/22/1998), Hyperlipidemia, Internal and external hemorrhoids without complication, Limping (11/24/2022), Lip lesion (09/08/2023), RUQ pain (03/06/2023), Tinnitus of right ear (11/24/2022), and Tobacco abuse (11/22/2012). Discussed Past Medical History - Chronic right-sided back pain - Onychomycosis of toes - History of lung cancer - History of anemia - History of  liver cirrhosis  Social History - Patient is a smoker - Patient quit drinking   Discussed Results LABS Hb: 13.2 g/dL  PATHOLOGY Lip biopsy: Benign fungal infection   Problem list overviews that were updated at today's visit: Problem  Flank Pain   Chronic flank pain (ICD-10: M54.96) - Updated: Intermittent right flank pain responds to muscle relaxants, suggesting musculoskeletal etiology. Physical therapy referral in place for evaluation and treatment. Continuing as-needed methocarbamol for pain relief. Started around 2021, comes and goes. Always right flank lower toward posterior. Pain area about the size of baseball Pain is rarely severe, feels like a vice sometimes. Has to straighten up slowly when its flaring to prevent the pain from .    Anemia   Anemia (ICD-10: D64.9) - Updated: Lab results from 09/07/2023 show persistent mild normocytic anemia (Hgb 13.1 g/dL, MCV 29.5 fL). Comprehensive workup including iron studies, B12, folate, and hemolysis markers are within normal limits. Peripheral smear shows a few elliptocytes. Current assessment suggests possible anemia of chronic disease vs. early myelodysplastic syndrome. Plan includes repeating CBC in 3 months and considering hematology referral if anemia persists or worsens.  Lab Results  Component Value Date/Time   HGB 13.1 (L) 09/07/2023 09:59 AM   HGB 12.2 (L) 04/08/2023 10:59 AM   HGB 12.9 (L) 03/06/2023 10:15 AM   HGB 13.8 11/24/2022 10:14 AM   HGB 14.6 05/11/2015 03:02 PM   Lab Results  Component Value Date/Time   MCV 94.1 09/07/2023 09:59 AM   MCV 94.6 04/08/2023 10:59 AM   MCV 94.7 03/06/2023 10:15 AM   MCV 100.8 (H) 11/24/2022 10:14 AM   MCV 100.0 05/11/2015 03:02 PM      Component Value Date/Time   IRON 68 09/07/2023 0949   TIBC 316.4 09/07/2023 0949   FERRITIN 34.4 09/07/2023 0949   FERRITIN 34.4 09/07/2023 0949   IRONPCTSAT 21.5 09/07/2023 0949    Lab Results  Component Value Date   RETICCTPCT 1.0  09/07/2023   RETICCTABS 40,600 09/07/2023   PATHREVIEW  09/07/2023     Comment:     Myeloid population consists predominantly of mature segmented neutrophils. A few lymphocytes appear reactive. Review of the peripheral smear reveals adequate numbers of platelets. Normocytic anemia with a few Elliptocytes. Reviewed by Nehemiah Massed Clementeen Graham, MD  (Electronic Signature on File)     09/08/2023         Hyperlipidemia, Acquired    Prior history: Tc elevated but good HDL.Marland Kitchen. no need for medication(s) Statin myopathy history: never took statin Current Regimen: Diet and exercise - reports eating heart healthy diet  Lab Results  Component Value Date   CHOL 219 (H) 11/24/2022   Lab Results  Component Value Date   HDL 113.20 11/24/2022   Lab Results  Component Value Date   LDLCALC 96 11/24/2022   Lab Results  Component Value Date   TRIG 48.0 11/24/2022   Lab Results  Component Value Date   CHOLHDL 2 11/24/2022   No results found for: "LDLDIRECT"  The ASCVD Risk score (Arnett DK, et al., 2019) failed to calculate for the following reasons:   The valid HDL cholesterol range is 20 to 100 mg/dL    Palpitations   Interim history these haven't been bothering him much lately Prior history :  has intermittent palpitations and 2012 evaluation by cardiologist Hurman Horn documented the tachybradycardia and possibly doing loop recorder versus seeing electrophysiology but he has not followed up in about 12 years    Limping   Abnormal gait (ICD-10: R26.89) -  Updated: Chronic limping gait with associated knee and hip pain. Etiology remains unclear. Physical therapy referral in place for comprehensive evaluation and treatment plan. Interim history: plans to see rehab soon, taking B12 as advised  Keeps left leg straight when walking- says the problem is in the hip though the knee doesn't bend and there is no pain in either.   Lip Lesion (Resolved)   Lesion of lip (ICD-10: R23.8) -  New: Suspicious lesion on lip noted. Dermatology appointment scheduled for late October for evaluation of possible skin cancer. Patient advised to monitor for changes and seek earlier evaluation if rapid growth occurs.   Ruq Pain (Resolved)   Has been improving  Does wrap around to kidney area Came and gone off/on since 2022 He reports it does NOT associated with constipation, diarrhea, nausea or any other symptom(s)    Cirrhosis of Liver (Hcc) (Resolved)   Presumed alcohol associated with gynecomastia Speculative diagnosis based on stigmata (gynecomastia, asterixis, protuberant abdomen presumed ascitic)- all these problem resolved with alcohol made a personal choice to discontinue  Current status: resolving Contributing factors: History of alcohol use, now abstinent x 1 year  Current management: Alcohol abstinence Vitamin B12 supplementation   Latest data:  No current liver pain No withdrawal symptoms   Monitoring plan:  Monitor LFTs Assess for complications   Gynecomastia, Male (Resolved)   Suspicious liver disease induced Has discontinued all alcohol at beginning of 2024    Current Outpatient Medications on File Prior to Visit  Medication Sig   Camphor-Menthol-Methyl Sal (SALONPAS) 3.12-27-08 % PTCH Apply 1 Act topically daily at 6 (six) AM.   ciclopirox (LOPROX) 0.77 % cream Apply topically 2 (two) times daily.   cyanocobalamin (VITAMIN B12) 1000 MCG/ML injection Inject into the muscle.   diclofenac Sodium (VOLTAREN) 1 % GEL Apply 4 g topically 4 (four) times daily as needed.   famotidine-calcium carbonate-magnesium hydroxide (PEPCID COMPLETE) 10-800-165 MG chewable tablet Chew 1 tablet by mouth daily as needed (take these only as needed for heartburn acid symptoms).   hydrocortisone 2.5 % ointment Apply topically 2 (two) times daily. To lesion on lip   methocarbamol (ROBAXIN) 500 MG tablet Take 1 tablet (500 mg total) by mouth every 6 (six) hours as needed for muscle  spasms. Muscle relaxer for back pain   Multiple Vitamins-Minerals (ONE DAILY FOR MEN 50+ ADVANCED) TABS Take 1 tablet by mouth daily.   multivitamin (ONE-A-DAY MEN'S) TABS tablet Take 1 tablet by mouth daily.   omeprazole (PRILOSEC) 10 MG capsule Take 1 capsule (10 mg total) by mouth daily.   thiamine (VITAMIN B1) 100 MG tablet Take 1 tablet (100 mg total) by mouth daily.   No current facility-administered medications on file prior to visit.  There are no discontinued medications.    Objective   Physical Exam     02/09/2024    9:30 AM 12/22/2023    1:07 PM 11/09/2023   10:16 AM  Vitals with BMI  Height 6\' 1"   6\' 1"   Weight 170 lbs 13 oz  168 lbs  BMI 22.54  22.17  Systolic 120 112 161  Diastolic 68 68 68  Pulse 87 88 73   Wt Readings from Last 10 Encounters:  02/09/24 170 lb 12.8 oz (77.5 kg)  11/09/23 168 lb (76.2 kg)  09/07/23 168 lb 6.4 oz (76.4 kg)  07/27/23 168 lb 12.8 oz (76.6 kg)  03/27/23 175 lb 3.2 oz (79.5 kg)  03/06/23 179 lb 6.4 oz (81.4 kg)  02/04/23 182  lb 12.8 oz (82.9 kg)  12/31/22 182 lb 6.4 oz (82.7 kg)  11/24/22 186 lb 6.4 oz (84.6 kg)  04/10/16 139 lb (63 kg)   Vital signs reviewed.  Nursing notes reviewed. Weight trend reviewed. Abnormalities and Problem-Specific physical exam findings:  gynecomastia, abdomen swelling, asterixis have all resolved  General Appearance:  No acute distress appreciable.   Well-groomed, healthy-appearing male.  Well proportioned with no abnormal fat distribution.  Good muscle tone. Pulmonary:  Normal work of breathing at rest, no respiratory distress apparent. SpO2: 96 %  Musculoskeletal: All extremities are intact.  Neurological:  Awake, alert, oriented, and engaged.  No obvious focal neurological deficits or cognitive impairments.  Sensorium seems unclouded.   Speech is clear and coherent with logical content. Psychiatric:  Appropriate mood, pleasant and cooperative demeanor, thoughtful and engaged during the exam    No  results found for any visits on 02/09/24. Office Visit on 12/22/2023  Component Date Value   SURGICAL PATHOLOGY 12/22/2023                     Value:SURGICAL PATHOLOGY [facility] [address] [place], [place] [address] Telephone [phone] or [phone] Fax [phone]  REPORT OF DERMATOPATHOLOGY   Accession #: [id number] Patient Name: JALYNN, BETZOLD Visit # : [id number]  MRN: [id number] Cytotechnologist: George Hugh Md, Efraim Kaufmann, Dermatopathologist, Electronic Signature DOB/Age 15-Aug-1961 (Age: 89) Gender: M Collected Date: 12/22/2023 Received Date: 12/22/2023  FINAL DIAGNOSIS       1. Skin, mid upper vermilion lip :       LICHENOID DERMATITIS, SEE DESCRIPTION       DATE SIGNED OUT: 12/25/2023 ELECTRONIC SIGNATURE : Munoz-Bishop Md, Melissa, Dermatopathologist, Electronic Signature  MICROSCOPIC DESCRIPTION 1. There is a patchy lichenoid lymphocytic infiltrate that focally obscures the basement membranes where there is vacuolar change and a few necrotic keratinocytes.  There is hyperplasia of the squamous epithelium.  The subepithelial tissue shows some fibr                         oplasia with a sparse lymphocytic infiltrate. These changes are suggestive of chronic external irritation. There is no significant atypia or malignancy.  Following review of the hematoxylin and eosin sections, a PAS stain was obtained to exclude a fungal infection. The PAS stain is negative for fungal organisms.  No bacteria are seen with a Gram stain. Multiple levels taken through the submitted block are examined. The findings are those of a lichenoid dermatitis which is not completely diagnostic.  The differential diagnosis includes lichen planus and less specific lichenoid eruptions, such as secondary to drugs.  CASE COMMENTS STAINS USED IN DIAGNOSIS: H&E H&E *RECUT DEEPER X 6 LEVELS *RECUT DEEPER X 6 LEVELS *RECUT DEEPER X 6 LEVELS *RECUT  DEEPER X 6 LEVELS Stains used in diagnosis 1 *PAS/F Stain, 1 *PAS/F Stain, 1 Gram +/- Stain, 1 Gram +/- Stain    CLINICAL HISTORY  SPECIMEN(S) OBTAINED 1. Skin, Mid Upper Vermilion Lip  SPECIMEN COMMENTS: SPECIMEN CLIN                         ICAL INFORMATION: 1. Neoplasm of uncertain behavior of skin, SCC vs other    Gross Description 1. Formalin fixed specimen received:  10 X 7 X 1 MM, TOTO (6 P) (1 B) ( qb )=a      Additional formalin fixed specimen received in bottle:  8  X 7 X 1 MM, TOTO (3 P)      (1 B) = b    ( qb )        Report signed out from the following location(s) [place] [address] [place], [place] [address] CLIA #: [id number]  [facility] [address] [place], [place] [address] CLIA #: [id number]   Office Visit on 09/07/2023  Component Date Value   CRP 09/07/2023 <1.0    Sed Rate 09/07/2023 5    Folate 09/07/2023 >24.2    Vitamin B-12 09/07/2023 404    Ferritin 09/07/2023 34.4    DAT, Polyspecific 09/07/2023 NEGATIVE    Haptoglobin 09/07/2023 203    LDH 09/07/2023 121    TSH 09/07/2023 1.950    WBC 09/07/2023 6.6    RBC 09/07/2023 4.08 (L)    Hemoglobin 09/07/2023 13.1 (L)    HCT 09/07/2023 38.4 (L)    MCV 09/07/2023 94.1    MCH 09/07/2023 32.1    MCHC 09/07/2023 34.1    RDW 09/07/2023 12.9    Platelets 09/07/2023 318    MPV 09/07/2023 9.6    Path Review 09/07/2023     Iron 09/07/2023 68    Transferrin 09/07/2023 226.0    Saturation Ratios 09/07/2023 21.5    Ferritin 09/07/2023 34.4    TIBC 09/07/2023 316.4    Retic Ct Pct 09/07/2023 1.0    ABS Retic 09/07/2023 40,600    Path Review 09/07/2023    Lab on 06/01/2023  Component Date Value   Color, Urine 06/01/2023 YELLOW    APPearance 06/01/2023 CLEAR    Specific Gravity, Urine 06/01/2023 1.025    pH 06/01/2023 6.0    Total Protein, Urine 06/01/2023 NEGATIVE    Urine Glucose 06/01/2023 NEGATIVE    Ketones, ur 06/01/2023 NEGATIVE     Bilirubin Urine 06/01/2023 NEGATIVE    Hgb urine dipstick 06/01/2023 NEGATIVE    Urobilinogen, UA 06/01/2023 0.2    Leukocytes,Ua 06/01/2023 NEGATIVE    Nitrite 06/01/2023 NEGATIVE    WBC, UA 06/01/2023 0-2/hpf    RBC / HPF 06/01/2023 none seen    Squamous Epithelial / HPF 06/01/2023 Rare(0-4/hpf)   Lab on 04/09/2023  Component Date Value   Fecal Occult Bld 04/09/2023 Negative   Lab on 04/08/2023  Component Date Value   Color, Urine 04/08/2023 YELLOW    APPearance 04/08/2023 CLEAR    Specific Gravity, Urine 04/08/2023 1.020    pH 04/08/2023 7.0    Total Protein, Urine 04/08/2023 NEGATIVE    Urine Glucose 04/08/2023 NEGATIVE    Ketones, ur 04/08/2023 NEGATIVE    Bilirubin Urine 04/08/2023 NEGATIVE    Hgb urine dipstick 04/08/2023 NEGATIVE    Urobilinogen, UA 04/08/2023 1.0    Leukocytes,Ua 04/08/2023 TRACE (A)    Nitrite 04/08/2023 NEGATIVE    WBC, UA 04/08/2023 3-6/hpf (A)    RBC / HPF 04/08/2023 0-2/hpf    Mucus, UA 04/08/2023 Presence of (A)    Squamous Epithelial / HPF 04/08/2023 Rare(0-4/hpf)    WBC 04/08/2023 6.3    RBC 04/08/2023 3.74 (L)    Hemoglobin 04/08/2023 12.2 (L)    HCT 04/08/2023 35.4 (L)    MCV 04/08/2023 94.6    MCHC 04/08/2023 34.6    RDW 04/08/2023 14.1    Platelets 04/08/2023 325.0    Neutrophils Relative % 04/08/2023 48.8    Lymphocytes Relative 04/08/2023 37.9    Monocytes Relative 04/08/2023 10.1    Eosinophils Relative 04/08/2023 2.4    Basophils Relative  04/08/2023 0.8    Neutro Abs 04/08/2023 3.1    Lymphs Abs 04/08/2023 2.4    Monocytes Absolute 04/08/2023 0.6    Eosinophils Absolute 04/08/2023 0.2    Basophils Absolute 04/08/2023 0.0   Office Visit on 03/27/2023  Component Date Value   Color, Urine 03/27/2023 DARK YELLOW    APPearance 03/27/2023 CLEAR    Specific Gravity, Urine 03/27/2023 1.018    pH 03/27/2023 7.5    Glucose, UA 03/27/2023 NEGATIVE    Bilirubin Urine 03/27/2023 NEGATIVE    Ketones, ur 03/27/2023 NEGATIVE    Hgb  urine dipstick 03/27/2023 NEGATIVE    Protein, ur 03/27/2023 NEGATIVE    Nitrite 03/27/2023 NEGATIVE    Leukocytes,Ua 03/27/2023 NEGATIVE    WBC, UA 03/27/2023 6-10 (A)    RBC / HPF 03/27/2023 NONE SEEN    Squamous Epithelial / HPF 03/27/2023 0-5    Bacteria, UA 03/27/2023 NONE SEEN    Hyaline Cast 03/27/2023 NONE SEEN    Note 03/27/2023    Office Visit on 03/06/2023  Component Date Value   COLOGUARD 04/11/2023 Negative    Color, Urine 03/06/2023 YELLOW    APPearance 03/06/2023 CLEAR    Specific Gravity, Urine 03/06/2023 1.015    pH 03/06/2023 6.5    Glucose, UA 03/06/2023 NEGATIVE    Bilirubin Urine 03/06/2023 NEGATIVE    Ketones, ur 03/06/2023 NEGATIVE    Hgb urine dipstick 03/06/2023 NEGATIVE    Protein, ur 03/06/2023 NEGATIVE    Nitrite 03/06/2023 NEGATIVE    Leukocytes,Ua 03/06/2023 NEGATIVE    WBC, UA 03/06/2023 NONE SEEN    RBC / HPF 03/06/2023 NONE SEEN    Squamous Epithelial / HPF 03/06/2023 0-5    Bacteria, UA 03/06/2023 NONE SEEN    Hyaline Cast 03/06/2023 NONE SEEN    Note 03/06/2023     Sodium 03/06/2023 141    Potassium 03/06/2023 4.3    Chloride 03/06/2023 107    CO2 03/06/2023 29    Glucose, Bld 03/06/2023 90    BUN 03/06/2023 11    Creatinine, Ser 03/06/2023 0.85    Total Bilirubin 03/06/2023 0.5    Alkaline Phosphatase 03/06/2023 70    AST 03/06/2023 15    ALT 03/06/2023 13    Total Protein 03/06/2023 6.3    Albumin 03/06/2023 3.9    GFR 03/06/2023 93.33    Calcium 03/06/2023 9.6    WBC 03/06/2023 6.0    RBC 03/06/2023 3.93 (L)    Platelets 03/06/2023 326.0    Hemoglobin 03/06/2023 12.9 (L)    HCT 03/06/2023 37.2 (L)    MCV 03/06/2023 94.7    MCHC 03/06/2023 34.6    RDW 03/06/2023 13.7    Amylase 03/06/2023 31    Lipase 03/06/2023 15.0   No image results found. No results found.DG Chest 2 View Result Date: 11/27/2021 CLINICAL DATA:  Persistent cough over the last 3 weeks EXAM: CHEST - 2 VIEW COMPARISON:  05/14/2015 FINDINGS: Heart size is  normal. Mediastinal shadows are normal. There is mild central bronchial thickening but there is no infiltrate, collapse or effusion. Ordinary mild degenerative changes affect the spine. IMPRESSION: Possible bronchitis.  No consolidation or collapse. Electronically Signed   By: Paulina Fusi M.D.   On: 11/27/2021 10:19       Assessment & Plan History of smoking 25-50 pack years Smoking Cessation Continued smoking heightens heart disease risk. Quitting smoking is advised, with a recommendation to switch to nicotine-only products like Zen pouches. Palpitations Palpitations Intermittent palpitations have not occurred recently. The  CT scan for lung cancer screening may provide incidental cardiac information to evaluate heart function. Limping Limping Chronic limping persists without a clear cause. Legs are thin with limited muscle mass and activity. Financial constraints delay physical therapy, but its benefits and increased activity were discussed. Physical therapy will start in two months, with encouragement for increased physical activity, including walking and light weight lifting. Hyperlipidemia, acquired Hyperlipidemia Cholesterol levels are elevated, increasing heart disease risk, especially with a smoking history. Dietary modifications, including avocados and extra virgin olive oil, are advised to lower cholesterol and prevent cardiovascular events. Cholesterol levels will be reassessed after the CT scan. Flank pain Chronic Right Flank Pain Intermittent right flank pain may be kidney-related, though previous evaluations for kidney stones were inconclusive. Financial constraints delayed further imaging. A CT scan for lung cancer screening will also evaluate the kidney, and the pain will be documented in the request. The scan will be scheduled at an affordable location. Alcoholic cirrhosis of liver with ascites (HCC) Liver Cirrhosis (Resolved) A history of liver cirrhosis, likely due to alcohol  use, has resolved after quitting alcohol. There is no current liver damage. The importance of abstaining from alcohol is reinforced, with liver function monitored as needed. Onychomycosis Onychomycosis The fungal toenail infection is partially resolved, requiring no further medication. Monitoring for recurrence is advised.  General Health Maintenance High cardiovascular risk is present due to smoking and hyperlipidemia. Dietary changes and smoking cessation are advised, with a recommendation for whole grain oats over refined oats. A follow-up is scheduled in 3-6 months, depending on CT scan results.  Follow-up A liver ultrasound is scheduled for March 3, and a lip check for March 6. A follow-up visit will be scheduled in 3-6 months, contingent on CT scan results.     Orders Placed During this Encounter:   Orders Placed This Encounter  Procedures   Low Dose CT Chest w/o Contrast for Lung Cancer Screening [[id number]]    Patient with right flank pain also check for stones in R kidney if possible.    Standing Status:   Future    Expiration Date:   02/08/2025    Preferred Imaging Location?:   [facility]   Ambulatory referral to Physical Therapy    Referral Priority:   Routine    Referral Type:   Physical Medicine    Referral Reason:   Specialty Services Required    Requested Specialty:   Physical Therapy    Number of Visits Requested:   1   No orders of the defined types were placed in this encounter.      This document was synthesized by artificial intelligence (Abridge) using HIPAA-compliant recording of the clinical interaction;   We discussed the use of AI scribe software for clinical note transcription with the patient, who gave verbal consent to proceed.    Additional Info: This encounter employed state-of-the-art, real-time, collaborative documentation. The patient actively reviewed and assisted in updating their electronic medical record on a shared screen, ensuring  transparency and facilitating joint problem-solving for the problem list, overview, and plan. This approach promotes accurate, informed care. The treatment plan was discussed and reviewed in detail, including medication safety, potential side effects, and all patient questions. We confirmed understanding and comfort with the plan. Follow-up instructions were established, including contacting the office for any concerns, returning if symptoms worsen, persist, or new symptoms develop, and precautions for potential emergency department visits.

## 2024-02-15 ENCOUNTER — Encounter: Payer: Self-pay | Admitting: Internal Medicine

## 2024-02-22 ENCOUNTER — Other Ambulatory Visit: Payer: Medicaid Other

## 2024-02-25 ENCOUNTER — Ambulatory Visit: Payer: Medicaid Other | Admitting: Dermatology

## 2024-03-09 ENCOUNTER — Ambulatory Visit: Payer: Medicaid Other | Admitting: Physical Therapy

## 2024-03-15 ENCOUNTER — Other Ambulatory Visit

## 2024-03-16 ENCOUNTER — Encounter: Payer: Self-pay | Admitting: Internal Medicine

## 2024-03-22 ENCOUNTER — Ambulatory Visit: Admitting: Physical Therapy

## 2024-03-23 ENCOUNTER — Encounter: Payer: Self-pay | Admitting: Internal Medicine

## 2024-03-24 ENCOUNTER — Other Ambulatory Visit

## 2024-03-28 ENCOUNTER — Ambulatory Visit: Admitting: Dermatology

## 2024-03-30 ENCOUNTER — Telehealth: Payer: Self-pay | Admitting: Internal Medicine

## 2024-03-30 NOTE — Telephone Encounter (Unsigned)
 Copied from CRM [id number]. Topic: Referral - Prior Authorization Question >> Mar 30, 2024 10:11 AM Cammy Copa D wrote: Reason for CRM: Claris Gower from [facility] calling because authorization for chest CT lung cancer screening, had authorization but that one expired, need new order.  [facility]: [phone] ext: [phone]

## 2024-03-31 ENCOUNTER — Other Ambulatory Visit: Payer: Self-pay

## 2024-03-31 DIAGNOSIS — Z87891 Personal history of nicotine dependence: Secondary | ICD-10-CM

## 2024-03-31 NOTE — Telephone Encounter (Signed)
 Ordered new Lung Cancer Screening imaging and sent it to [facility].

## 2024-03-31 NOTE — Addendum Note (Signed)
 Addended by: Donzetta Starch on: 03/31/2024 08:22 AM   Modules accepted: Orders

## 2024-04-21 ENCOUNTER — Ambulatory Visit: Admitting: Dermatology

## 2024-04-25 ENCOUNTER — Encounter: Payer: Self-pay | Admitting: Internal Medicine

## 2024-04-26 ENCOUNTER — Ambulatory Visit (INDEPENDENT_AMBULATORY_CARE_PROVIDER_SITE_OTHER): Admitting: Dermatology

## 2024-04-26 ENCOUNTER — Other Ambulatory Visit

## 2024-04-26 ENCOUNTER — Encounter: Payer: Self-pay | Admitting: Dermatology

## 2024-04-26 VITALS — BP 99/62 | HR 69

## 2024-04-26 DIAGNOSIS — F17210 Nicotine dependence, cigarettes, uncomplicated: Secondary | ICD-10-CM

## 2024-04-26 DIAGNOSIS — L280 Lichen simplex chronicus: Secondary | ICD-10-CM | POA: Diagnosis not present

## 2024-04-26 MED ORDER — TRIAMCINOLONE ACETONIDE 10 MG/ML IJ SUSP
10.0000 mg | Freq: Once | INTRAMUSCULAR | Status: AC
Start: 2024-04-26 — End: 2024-04-26
  Administered 2024-04-26: 10 mg

## 2024-04-26 NOTE — Progress Notes (Signed)
   Follow-Up Visit   Subjective  Derek Bullock is a 63 y.o. male who presents for the following: Follow up after treatment for Lichenoid dermatitis on his lip. Has been using hydrocortisone  2.5% ointment with too much improvement. States that he does smoke and sometimes holds his cigarette in the area.   The following portions of the chart were reviewed this encounter and updated as appropriate: medications, allergies, medical history  Review of Systems:  No other skin or systemic complaints except as noted in HPI or Assessment and Plan.  Objective  Well appearing patient in no apparent distress; mood and affect are within normal limits.   A focused examination was performed of the following areas: face  Relevant exam findings are noted in the Assessment and Plan.    Assessment & Plan   Lichenoid Dermatitis Discussed increasing strength of topical steroid vs injectable steroid; will proceed with ILK today and re-evaluated in 2 weeks LICHENOID DERMATITIS Mid Upper Vermilion Lip Procedure Note Intralesional Injection  Location: mid upper vermilion lip  Informed Consent: Discussed risks (infection, pain, bleeding, bruising, thinning of the skin, loss of skin pigment, lack of resolution, and recurrence of lesion) and benefits of the procedure, as well as the alternatives. Informed consent was obtained. Preparation: The area was prepared a standard fashion.  Procedure Details: An intralesional injection was performed with Kenalog 10 mg/cc. Aaron Aas5 cc in total were injected. NDC #: 5956-3875-64 Exp: Mar 2026  Total number of injections: 1  Plan: The patient was instructed on post-op care. Recommend OTC analgesia as needed for pain.  Related Medications hydrocortisone  2.5 % ointment Apply topically 2 (two) times daily. To lesion on lip triamcinolone acetonide (KENALOG) 10 MG/ML injection 10 mg   Return in about 2 weeks (around 05/10/2024) for f/u.  I, Haig Levan, Surg  Tech III, am acting as scribe for Deneise Finlay, MD.   Documentation: I have reviewed the above documentation for accuracy and completeness, and I agree with the above.  Deneise Finlay, MD

## 2024-04-28 ENCOUNTER — Encounter: Payer: Self-pay | Admitting: Internal Medicine

## 2024-04-28 NOTE — Telephone Encounter (Signed)
 YOUR LUNG CANCER SCREENING REQUEST   Thank you for your interest in low-dose CT (LDCT) lung cancer screening. I've reviewed your request and want to share some important information with you. Insurance Coverage Status CURRENT SITUATION: Your [place] Medicaid plan has determined that you don't currently meet the standard eligibility criteria for coverage of LDCT screening.  Standard Screening Guidelines Organization Recommended Criteria  U.S. Chief Financial Officer (USPSTF) Annual screening for adults aged 50-80 years who have at least a 20 pack-year smoking history and currently smoke or quit within the past 15 years  American Cancer Society Same as USPSTF: yearly screening for people aged 14-80 with a 20 pack-year smoking history who currently smoke or have quit in the last 15 years  Additional Options We Can Explore Even when standard criteria aren't met, we have several options to consider: Personalized Risk Assessment: Some guidelines support using risk calculators that consider additional factors beyond smoking history Other Risk Factors: Family history of lung cancer, lung diseases like COPD, exposure to asbestos or radon, or prior radiation therapy may justify screening Diagnostic CT: If you have symptoms like persistent cough, coughing up blood, chest pain, or unexplained weight loss, a diagnostic (full-dose) CT might be medically necessary and covered differently than screening YOUR ACTION PLAN   Next Step Please schedule an in-person appointment so we can review your specific situation  At Your Appointment We will:  Review your complete risk profile  Discuss personalized risk assessment  Evaluate any symptoms  Develop a plan that works for you   How to Schedule Call our office at [phone]) [id number] or use the patient portal to set up your visit  Your health is my top priority. I'm committed to working with you to find the best approach for monitoring your lung health, even if it means  exploring alternatives to standard screening protocols. I look forward to seeing you soon, Dr. Scherrie Curt Primary Care Physician

## 2024-04-29 NOTE — Telephone Encounter (Signed)
 Spoke with pt about the denial he is going to make appt to come back in to review with pcp.

## 2024-05-09 ENCOUNTER — Encounter: Payer: Self-pay | Admitting: Internal Medicine

## 2024-05-09 ENCOUNTER — Ambulatory Visit: Payer: Medicaid Other | Admitting: Internal Medicine

## 2024-05-09 VITALS — BP 110/60 | HR 72 | Temp 98.3°F | Ht 73.0 in | Wt 165.8 lb

## 2024-05-09 DIAGNOSIS — Z87891 Personal history of nicotine dependence: Secondary | ICD-10-CM | POA: Diagnosis not present

## 2024-05-09 DIAGNOSIS — R2689 Other abnormalities of gait and mobility: Secondary | ICD-10-CM | POA: Diagnosis not present

## 2024-05-09 DIAGNOSIS — L280 Lichen simplex chronicus: Secondary | ICD-10-CM | POA: Insufficient documentation

## 2024-05-09 DIAGNOSIS — E785 Hyperlipidemia, unspecified: Secondary | ICD-10-CM

## 2024-05-09 DIAGNOSIS — D649 Anemia, unspecified: Secondary | ICD-10-CM | POA: Diagnosis not present

## 2024-05-09 DIAGNOSIS — R1011 Right upper quadrant pain: Secondary | ICD-10-CM

## 2024-05-09 DIAGNOSIS — R109 Unspecified abdominal pain: Secondary | ICD-10-CM | POA: Diagnosis not present

## 2024-05-09 DIAGNOSIS — K219 Gastro-esophageal reflux disease without esophagitis: Secondary | ICD-10-CM

## 2024-05-09 LAB — CBC WITH DIFFERENTIAL/PLATELET
Basophils Absolute: 0 10*3/uL (ref 0.0–0.1)
Basophils Relative: 0.5 % (ref 0.0–3.0)
Eosinophils Absolute: 0.1 10*3/uL (ref 0.0–0.7)
Eosinophils Relative: 1.7 % (ref 0.0–5.0)
HCT: 37.6 % — ABNORMAL LOW (ref 39.0–52.0)
Hemoglobin: 12.8 g/dL — ABNORMAL LOW (ref 13.0–17.0)
Lymphocytes Relative: 34.1 % (ref 12.0–46.0)
Lymphs Abs: 2.5 10*3/uL (ref 0.7–4.0)
MCHC: 34.1 g/dL (ref 30.0–36.0)
MCV: 93.3 fl (ref 78.0–100.0)
Monocytes Absolute: 0.6 10*3/uL (ref 0.1–1.0)
Monocytes Relative: 7.6 % (ref 3.0–12.0)
Neutro Abs: 4.1 10*3/uL (ref 1.4–7.7)
Neutrophils Relative %: 56.1 % (ref 43.0–77.0)
Platelets: 300 10*3/uL (ref 150.0–400.0)
RBC: 4.03 Mil/uL — ABNORMAL LOW (ref 4.22–5.81)
RDW: 13.6 % (ref 11.5–15.5)
WBC: 7.3 10*3/uL (ref 4.0–10.5)

## 2024-05-09 LAB — COMPREHENSIVE METABOLIC PANEL WITH GFR
ALT: 12 U/L (ref 0–53)
AST: 16 U/L (ref 0–37)
Albumin: 4 g/dL (ref 3.5–5.2)
Alkaline Phosphatase: 80 U/L (ref 39–117)
BUN: 10 mg/dL (ref 6–23)
CO2: 29 meq/L (ref 19–32)
Calcium: 9.1 mg/dL (ref 8.4–10.5)
Chloride: 107 meq/L (ref 96–112)
Creatinine, Ser: 0.76 mg/dL (ref 0.40–1.50)
GFR: 95.75 mL/min (ref >60.00)
Glucose, Bld: 105 mg/dL — ABNORMAL HIGH (ref 70–99)
Potassium: 3.8 meq/L (ref 3.5–5.1)
Sodium: 141 meq/L (ref 135–145)
Total Bilirubin: 0.4 mg/dL (ref 0.2–1.2)
Total Protein: 6.2 g/dL (ref 6.0–8.3)

## 2024-05-09 NOTE — Patient Instructions (Addendum)
 It was a pleasure seeing you today! Your health and satisfaction are our top priorities.  Derek Curt, MD  Your Providers PCP: Anthon Kins, MD,  [phone]) Referring Provider: Anthon Kins, MD,  [phone]) Care Team Provider: Verona Goodwill, MD,  [phone])     NEXT STEPS: [x]  Early Intervention: Schedule sooner appointment, call our on-call services, or go to emergency room if there is any significant Increase in pain or discomfort New or worsening symptoms Sudden or severe changes in your health [x]  Flexible Follow-Up: We recommend a Return in about 3 months (around 08/09/2024). for optimal routine care. This allows for progress monitoring and treatment adjustments. [x]  Preventive Care: Schedule your annual preventive care visit! It's typically covered by insurance and helps identify potential health issues early. [x]  Lab & X-ray Appointments: Incomplete tests scheduled today, or call to schedule. X-rays: [facility] (M-F, 8:30am-noon or 1pm-5pm). [x]  Medical Information Release: Sign a release form at front desk to obtain relevant medical information we don't have.  MAKING THE MOST OF OUR FOCUSED 20 MINUTE APPOINTMENTS: [x]   Clearly state your top concerns at the beginning of the visit to focus our discussion [x]   If you anticipate you will need more time, please inform the front desk during scheduling - we can book multiple appointments in the same week. [x]   If you have transportation problems- use our convenient video appointments or ask about transportation support. [x]   We can get down to business faster if you use MyChart to update information before the visit and submit non-urgent questions before your visit. Thank you for taking the time to provide details through MyChart.  Let our nurse know and she can import this information into your encounter documents.  Arrival and Wait Times: [x]   Arriving on time ensures that everyone receives  prompt attention. [x]   Early morning (8a) and afternoon (1p) appointments tend to have shortest wait times. [x]   Unfortunately, we cannot delay appointments for late arrivals or hold slots during phone calls.  Getting Answers and Following Up [x]   Simple Questions & Concerns: For quick questions or basic follow-up after your visit, reach us  at [phone] or MyChart messaging. [x]   Complex Concerns: If your concern is more complex, scheduling an appointment might be best. Discuss this with the staff to find the most suitable option. [x]   Lab & Imaging Results: We'll contact you directly if results are abnormal or you don't use MyChart. Most normal results will be on MyChart within 2-3 business days, with a review message from Dr. Boston Byers. Haven't heard back in 2 weeks? Need results sooner? Contact us  at [phone]. [x]   Referrals: Our referral coordinator will manage specialist referrals. The specialist's office should contact you within 2 weeks to schedule an appointment. Call us  if you haven't heard from them after 2 weeks.  Staying Connected [x]   MyChart: Activate your MyChart for the fastest way to access results and message us . See the last page of this paperwork for instructions on how to activate.  Bring to Your Next Appointment [x]   Medications: Please bring all your medication bottles to your next appointment to ensure we have an accurate record of your prescriptions. [x]   Health Diaries: If you're monitoring any health conditions at home, keeping a diary of your readings can be very helpful for discussions at your next appointment.  Billing [x]   X-ray & Lab Orders: These are billed by separate companies. Contact the invoicing company directly for questions or concerns. [  x]  Visit Charges: Discuss any billing inquiries with our administrative services team.  Your Satisfaction Matters [x]   Share Your Experience: We strive for your satisfaction! If you have any complaints, or  preferably compliments, please let Dr. Boston Byers know directly or contact our Practice Administrators, Olinda Bertrand or [employer], by asking at the front desk.   Reviewing Your Records [x]   Review this early draft of your clinical encounter notes below and the final encounter summary tomorrow on MyChart after its been completed.  All orders placed so far are visible here: History of smoking 25-50 pack years Assessment & Plan: Tobacco Use: Medium Risk (05/09/2024)   Patient History    Smoking Tobacco Use: Former    Smokeless Tobacco Use: Never    Passive Exposure: Not on file   Counseling given: now stopped, we just reordering LDCT which was denied.   reports that he quit smoking about 9 months ago. His smoking use included cigarettes. He started smoking about 45 years ago. He has a 33.9 pack-year smoking history. He has never used smokeless tobacco.   Orders: -     CT CHEST LUNG CANCER SCREENING LOW DOSE WO CONTRAST; Future  Right flank pain -     Comprehensive metabolic panel with GFR -     Urinalysis w microscopic + reflex cultur  Right upper quadrant abdominal pain -     CT RENAL STONE STUDY; Future  Limping -     Ambulatory referral to Physical Therapy  Gastroesophageal reflux disease without esophagitis  Anemia, unspecified type -     CBC with Differential/Platelet  Hyperlipidemia, unspecified hyperlipidemia type -     Lipid panel; Future  Flank pain Assessment & Plan: Order CT, although improving.  Random and persist.   Lichenoid dermatitis Assessment & Plan: Thought it was skin cancer but::      1. Skin, mid upper vermilion lip :       LICHENOID DERMATITIS, SEE DESCRIPTION MICROSCOPIC DESCRIPTION 1. There is a patchy lichenoid lymphocytic infiltrate that focally obscures the basement membranes where there is vacuolar change and a few necrotic keratinocytes.  There is hyperplasia of the squamous epithelium.  The subepithelial tissue shows some  fibr   Other orders -     Extra Urine Specimen -     Urine Culture -     REFLEXIVE URINE CULTURE

## 2024-05-09 NOTE — Assessment & Plan Note (Addendum)
 Tobacco Use: Medium Risk (05/09/2024)   Patient History    Smoking Tobacco Use: Former    Smokeless Tobacco Use: Never    Passive Exposure: Not on file   Counseling given: now stopped, we just reordering LDCT which was denied.   reports that he quit smoking about 9 months ago. His smoking use included cigarettes. He started smoking about 45 years ago. He has a 33.9 pack-year smoking history. He has never used smokeless tobacco.  Significant smoking history with 33.9 pack years. Recently quit smoking. Eligible for lung cancer screening based on smoking history and age. Previous CT scan denial due to insufficient documentation, now resubmitted with complete smoking history. Reorder low-dose CT scan for lung cancer screening with updated documentation of smoking history.

## 2024-05-09 NOTE — Assessment & Plan Note (Addendum)
 Order CT, although improving.  Random and persist.

## 2024-05-09 NOTE — Assessment & Plan Note (Addendum)
 Updated problem overview for this problem to improve longitudinal management Thought it was skin cancer but::      1. Skin, mid upper vermilion lip :       LICHENOID DERMATITIS, SEE DESCRIPTION MICROSCOPIC DESCRIPTION 1. There is a patchy lichenoid lymphocytic infiltrate that focally obscures the basement membranes where there is vacuolar change and a few necrotic keratinocytes.  There is hyperplasia of the squamous epithelium.  The subepithelial tissue shows some fibr

## 2024-05-09 NOTE — Progress Notes (Signed)
 ==============================  [facility]: [phone]   -- Medical Office Visit --  Patient: Derek Bullock      Age: 63 y.o.       Sex:  male  Date:   05/09/2024 Today's Healthcare Provider: Anthon Kins, MD  ==============================   Chief Complaint: Hyperlipidemia and Osteoarthritis Flank pain and LDCT lung cancer screening authorization (insurance denied)  Discussed the use of AI scribe software for clinical note transcription with the patient, who gave verbal consent to proceed.  History of Present Illness 63 year old male who presents for a three-month follow-up and to discuss a CT scan eligibility issue.  He received a letter stating he did not meet the criteria for a CT scan, despite being 63 years old and having a significant smoking history. He started smoking at age 59, smoked a pack a day from 68 to 1996, quit for five years, and resumed smoking in 2002 at about 0.75 packs per day until quitting again eight months ago. His total smoking history amounts to 33.9 pack years.  He experienced a recent head cold with a cough and loose phlegm, which lasted about a day and a half. No new cough, hemoptysis, or shortness of breath. He has a history of pneumonia six times in his life.  He has a history of anemia and has been taking two to three tablespoons of extra virgin olive oil every morning since his last visit.  He reports right flank pain that has mostly resolved but occasionally experiences pain in the area of his appendix. The pain is random, not associated with any specific activity, and comes and goes.  His GERD symptoms are well-controlled with omeprazole . No recent weight loss and his weight remains stable.  He is preparing to start physical therapy for a limp, which is not associated with pain. He is unsure of the cause and is seeking further evaluation.  His father had kidney cancer, which is relevant given  his smoking history and current concerns about his kidney health.  Background Reviewed: Problem List: has History of colon polyps; Palpitations; Limping; Distal interphalangeal nodule; History of smoking 25-50 pack years; History of pneumonia; GERD (gastroesophageal reflux disease); Hyperlipidemia, acquired; Flank pain; Anemia; Onychomycosis; Hand arthritis; Exposure to environmental toxic substances; and Chronic right-sided low back pain with right-sided sciatica on their problem list. Past Medical History:  has a past medical history of Adenomatous colon polyp, Alcohol dependence with other alcohol-induced disorder (HCC) (11/24/2022), Arrhythmia (12/22/2005), Arthritis, Bilateral impacted cerumen (11/24/2022), Cerumen impaction (12/31/2022), Cirrhosis of liver (HCC) (11/24/2022), Diverticulosis, GERD (gastroesophageal reflux disease), Gynecomastia, male (05/11/2015), Hemorrhoid (12/22/1998), Hyperlipidemia, Internal and external hemorrhoids without complication, Limping (11/24/2022), Lip lesion (09/08/2023), RUQ pain (03/06/2023), Tinnitus of right ear (11/24/2022), and Tobacco abuse (11/22/2012). Past Surgical History:   has a past surgical history that includes Knee surgery (Left, 1996) and Cardioversion (2007). Social History:   reports that he quit smoking about 9 months ago. His smoking use included cigarettes. He started smoking about 45 years ago. He has a 33.9 pack-year smoking history. He has never used smokeless tobacco. He reports current alcohol use of about 4.0 standard drinks of alcohol per week. He reports current drug use. Drug: Marijuana. Family History:  family history includes Arthritis in his mother; Cancer in his father; Colon cancer in his paternal grandmother; Colon polyps in his brother; Diabetes in his mother; Glaucoma in his father; Heart attack in his mother; Heart disease in his father and mother;  Kidney disease in his father. Allergies:  is allergic to codeine.   Medication  Reconciliation: Current Outpatient Medications on File Prior to Visit  Medication Sig   Camphor-Menthol-Methyl Sal (SALONPAS ) 3.12-27-08 % PTCH Apply 1 Act topically daily at 6 (six) AM.   cyanocobalamin  (VITAMIN B12) 1000 MCG/ML injection Inject into the muscle.   diclofenac  Sodium (VOLTAREN ) 1 % GEL Apply 4 g topically 4 (four) times daily as needed.   famotidine -calcium  carbonate-magnesium  hydroxide (PEPCID  COMPLETE) 10-800-165 MG chewable tablet Chew 1 tablet by mouth daily as needed (take these only as needed for heartburn acid symptoms).   hydrocortisone  2.5 % ointment Apply topically 2 (two) times daily. To lesion on lip   methocarbamol  (ROBAXIN ) 500 MG tablet Take 1 tablet (500 mg total) by mouth every 6 (six) hours as needed for muscle spasms. Muscle relaxer for back pain   Multiple Vitamins-Minerals (ONE DAILY FOR MEN 50+ ADVANCED) TABS Take 1 tablet by mouth daily.   multivitamin (ONE-A-DAY MEN'S) TABS tablet Take 1 tablet by mouth daily.   omeprazole  (PRILOSEC) 10 MG capsule Take 1 capsule (10 mg total) by mouth daily.   thiamine  (VITAMIN B1) 100 MG tablet Take 1 tablet (100 mg total) by mouth daily.   ciclopirox  (LOPROX ) 0.77 % cream Apply topically 2 (two) times daily.   No current facility-administered medications on file prior to visit.  There are no discontinued medications.   Physical Exam:    05/09/2024    9:53 AM 04/26/2024    2:57 PM 02/09/2024    9:30 AM  Vitals with BMI  Height 6\' 1"   6\' 1"   Weight 165 lbs 13 oz  170 lbs 13 oz  BMI 21.88  22.54  Systolic 110 99 120  Diastolic 60 62 68  Pulse 72 69 87  Vital signs reviewed.  Nursing notes reviewed. Weight trend reviewed. Physical Exam General Appearance:  No acute distress appreciable.   Well-groomed, healthy-appearing male.  Well proportioned with no abnormal fat distribution.  Good muscle tone. Pulmonary:  Normal work of breathing at rest, no respiratory distress apparent. SpO2: 98 %  Musculoskeletal: All  extremities are intact.  Neurological:  Awake, alert, oriented, and engaged.  No obvious focal neurological deficits or cognitive impairments. He has a mild limping gait  Sensorium seems unclouded.   Speech is clear and coherent with logical content. Psychiatric:  Appropriate mood, pleasant and cooperative demeanor, thoughtful and engaged during the exam Physical Exam       No results found for any visits on 05/09/24. No image results found. No results found.      02/09/2024    9:34 AM 11/09/2023   10:20 AM 09/07/2023    9:16 AM 07/27/2023    9:24 AM  PHQ 2/9 Scores  PHQ - 2 Score 0 0 0 0   Results LABS Hemoglobin: 13.1 g/dL    Assessment & Plan History of smoking 25-50 pack years Tobacco Use: Medium Risk (05/09/2024)   Patient History    Smoking Tobacco Use: Former    Smokeless Tobacco Use: Never    Passive Exposure: Not on file   Counseling given: now stopped, we just reordering LDCT which was denied.   reports that he quit smoking about 9 months ago. His smoking use included cigarettes. He started smoking about 45 years ago. He has a 33.9 pack-year smoking history. He has never used smokeless tobacco.  Significant smoking history with 33.9 pack years. Recently quit smoking. Eligible for lung cancer screening based on smoking history  and age. Previous CT scan denial due to insufficient documentation, now resubmitted with complete smoking history. Reorder low-dose CT scan for lung cancer screening with updated documentation of smoking history.  Right flank pain Intermittent right-sided flank pain persists with improvement. No specific aggravating factors identified. Differential includes kidney-related issues, with smoking history increasing risk for kidney cancer. Order CT scan to evaluate right flank pain and rule out kidney-related issues.  It is already ordered Right upper quadrant abdominal pain  Limping Limping persists without associated pain, possibly related to back  issues. Physical therapy planned to assess and address limping. Schedule physical therapy to evaluate and address limping. Gastroesophageal reflux disease without esophagitis Heartburn is well-controlled with omeprazole . Discussed potential risks of long-term use, including kidney issues and nutritional deficiencies. Advised to attempt discontinuation to prevent long-term side effects. Attempt to discontinue omeprazole  to prevent long-term side effects. Anemia, unspecified type Anemia   Persistent normocytic anemia with hemoglobin at 13.1, slightly low. Likely residual from past alcohol use. No immediate concern for further investigation. Recheck blood count to monitor anemia. Hyperlipidemia, unspecified hyperlipidemia type Cholesterol levels need re-evaluation. He has been using olive oil as a dietary modification. Recheck cholesterol levels to assess the impact of dietary changes. Flank pain Order CT, although improving.  Random and persist. Lichenoid dermatitis Updated problem overview for this problem to improve longitudinal management Thought it was skin cancer but::      1. Skin, mid upper vermilion lip :       LICHENOID DERMATITIS, SEE DESCRIPTION MICROSCOPIC DESCRIPTION 1. There is a patchy lichenoid lymphocytic infiltrate that focally obscures the basement membranes where there is vacuolar change and a few necrotic keratinocytes.  There is hyperplasia of the squamous epithelium.  The subepithelial tissue shows some fibr       Orders Placed During this Encounter:   Orders Placed This Encounter  Procedures   Low Dose CT Chest w/o Contrast for Lung Cancer Screening [[id number]]    Standing Status:   Future    Expiration Date:   05/09/2025    Preferred Imaging Location?:   [facility]   No orders of the defined types were placed in this encounter.    This document was synthesized by artificial intelligence ([employer]) using HIPAA-compliant recording of the clinical interaction;    We discussed the use of AI scribe software for clinical note transcription with the patient, who gave verbal consent to proceed. additional Info: This encounter employed state-of-the-art, real-time, collaborative documentation. The patient actively reviewed and assisted in updating their electronic medical record on a shared screen, ensuring transparency and facilitating joint problem-solving for the problem list, overview, and plan. This approach promotes accurate, informed care. The treatment plan was discussed and reviewed in detail, including medication safety, potential side effects, and all patient questions. We confirmed understanding and comfort with the plan. Follow-up instructions were established, including contacting the office for any concerns, returning if symptoms worsen, persist, or new symptoms develop, and precautions for potential emergency department visits.

## 2024-05-10 ENCOUNTER — Ambulatory Visit: Payer: Self-pay | Admitting: Internal Medicine

## 2024-05-10 ENCOUNTER — Other Ambulatory Visit: Payer: Self-pay

## 2024-05-10 DIAGNOSIS — R109 Unspecified abdominal pain: Secondary | ICD-10-CM

## 2024-05-10 NOTE — Progress Notes (Signed)
 Key Findings Leukocyte esterase: 2+ (positive) Nitrites: Negative Bacteria on microscopy: None seen WBC on microscopy: 0-5/HPF (at the upper limit of normal) Interpretation: The dipstick was strongly positive for leukocyte esterase (suggesting pyuria), but there's no nitrite positivity or bacteria seen on the UA. That pattern often reflects sterile pyuria, which can be due to: Early or partially treated UTI (bacteria not yet grown) Non-bacterial causes (e.g., kidney stones, interstitial nephritis) Contamination  Management Plan: Obtain a clean-catch urine sample with culture Send for routine culture Hold antibiotics pending culture results No clear bacterial UTI yet; unnecessary antibiotics can lead to resistance. Correlate with symptoms (ask specifically about dysuria, frequency, urgency, fever or chills) If any of those appear, we can start empiric antibiotics while awaiting culture.   3. Patient-Centered Explanation "I saw that your urine test showed some white blood cells in the urine, which often means inflammation or infection, but we didn't find any bacteria on the initial test. This can happen with stones in the kidney, early infections, or sometimes just a false positive. To be safe, I'm going to send your urine off for a culture, but I won't start antibiotics unless that comes back positive or you develop symptoms like burning, urgency, or fever. In the meantime, stay well-hydrated and you can take Tylenol or ibuprofen for any discomfort. We're also waiting on your CT scan results, which will tell us  if there's a stone or another cause. As soon as those tests are back-usually in the next day or two-I'll call you to go over the plan."  Talking Points Cheat-Sheet What it means: "White blood cells in the urine with no bacteria" ? "Inflammation but not a clear infection yet." Why no antibiotics now: "We want to avoid unnecessary antibiotics and only treat if there's clear evidence of  infection." Next steps: "Urine culture + CT scan ? will guide targeted treatment." When to call: "If you develop burning on urination, more pain, fever or chills, call me right away."

## 2024-05-10 NOTE — Assessment & Plan Note (Signed)
 Heartburn is well-controlled with omeprazole . Discussed potential risks of long-term use, including kidney issues and nutritional deficiencies. Advised to attempt discontinuation to prevent long-term side effects. Attempt to discontinue omeprazole  to prevent long-term side effects.

## 2024-05-10 NOTE — Assessment & Plan Note (Signed)
 Limping persists without associated pain, possibly related to back issues. Physical therapy planned to assess and address limping. Schedule physical therapy to evaluate and address limping.

## 2024-05-10 NOTE — Assessment & Plan Note (Signed)
 Anemia   Persistent normocytic anemia with hemoglobin at 13.1, slightly low. Likely residual from past alcohol use. No immediate concern for further investigation. Recheck blood count to monitor anemia.

## 2024-05-11 ENCOUNTER — Other Ambulatory Visit (INDEPENDENT_AMBULATORY_CARE_PROVIDER_SITE_OTHER)

## 2024-05-11 DIAGNOSIS — E785 Hyperlipidemia, unspecified: Secondary | ICD-10-CM | POA: Diagnosis not present

## 2024-05-11 DIAGNOSIS — R109 Unspecified abdominal pain: Secondary | ICD-10-CM

## 2024-05-11 LAB — LIPID PANEL
Cholesterol: 158 mg/dL (ref 0–200)
HDL: 39.5 mg/dL (ref >39.00)
LDL Cholesterol: 101 mg/dL — ABNORMAL HIGH (ref 0–99)
NonHDL: 118.12
Total CHOL/HDL Ratio: 4
Triglycerides: 86 mg/dL (ref 0.0–149.0)
VLDL: 17.2 mg/dL (ref 0.0–40.0)

## 2024-05-11 NOTE — Progress Notes (Signed)
 Cholesterol good notify. However its very different from last time so I think we should check it again in 6 months.

## 2024-05-12 ENCOUNTER — Ambulatory Visit: Admitting: Dermatology

## 2024-05-12 LAB — URINALYSIS W MICROSCOPIC + REFLEX CULTURE
Bacteria, UA: NONE SEEN /HPF
Bilirubin Urine: NEGATIVE
Glucose, UA: NEGATIVE
Hgb urine dipstick: NEGATIVE
Hyaline Cast: NONE SEEN /LPF
Ketones, ur: NEGATIVE
Nitrites, Initial: NEGATIVE
Protein, ur: NEGATIVE
RBC / HPF: NONE SEEN /HPF (ref 0–2)
Specific Gravity, Urine: 1.006 (ref 1.001–1.035)
pH: 6.5 (ref 5.0–8.0)

## 2024-05-12 LAB — URINE CULTURE
MICRO NUMBER:: 16475022
SPECIMEN QUALITY:: ADEQUATE

## 2024-05-12 LAB — CULTURE INDICATED

## 2024-05-12 LAB — EXTRA URINE SPECIMEN

## 2024-05-12 NOTE — Progress Notes (Signed)
 This urine culture was suspicious for contaminated sample (skin bacteria getting into urine) So I  think we don't need antibiotics as long as there are no symptom(s) of urinary tract infection (UTI).   The flank pain is suspicious but if there are no other symptom(s) I don't think its coming from a kidney infection.

## 2024-05-19 ENCOUNTER — Ambulatory Visit (INDEPENDENT_AMBULATORY_CARE_PROVIDER_SITE_OTHER): Admitting: Dermatology

## 2024-05-19 ENCOUNTER — Encounter: Payer: Self-pay | Admitting: Dermatology

## 2024-05-19 VITALS — BP 100/59 | HR 63

## 2024-05-19 DIAGNOSIS — L280 Lichen simplex chronicus: Secondary | ICD-10-CM | POA: Diagnosis not present

## 2024-05-19 MED ORDER — FLUOCINONIDE 0.05 % EX OINT: 1.0000 | TOPICAL_OINTMENT | Freq: Two times a day (BID) | CUTANEOUS | 2 refills | Status: DC | PRN

## 2024-05-19 MED ORDER — TRIAMCINOLONE ACETONIDE 10 MG/ML IJ SUSP
10.0000 mg | Freq: Once | INTRAMUSCULAR | Status: AC
Start: 2024-05-19 — End: 2024-05-19
  Administered 2024-05-19: 10 mg

## 2024-05-19 NOTE — Progress Notes (Unsigned)
   Follow-Up Visit   Subjective  Derek Bullock is a 63 y.o. male who presents for the following: follow up for treatment of Lichenoid dermatitis.    The following portions of the chart were reviewed this encounter and updated as appropriate: medications, allergies, medical history  Review of Systems:  No other skin or systemic complaints except as noted in HPI or Assessment and Plan.  Objective  Well appearing patient in no apparent distress; mood and affect are within normal limits.  A focused examination was performed of the following areas: lips  Relevant exam findings are noted in the Assessment and Plan.    Assessment & Plan   LICHENOID DERMATITIS Mid Upper [place] Lip Procedure Note Intralesional Injection  Location: mid upper vermilion lip  Informed Consent: Discussed risks (infection, pain, bleeding, bruising, thinning of the skin, loss of skin pigment, lack of resolution, and recurrence of lesion) and benefits of the procedure, as well as the alternatives. Informed consent was obtained. Preparation: The area was prepared a standard fashion.  Procedure Details: An intralesional injection was performed with Kenalog  10 mg/cc. Aaron Aas5 cc in total were injected. NDC #: 1610-9604-54 Exp: Mar 2026  Total number of injections: 1  Plan: The patient was instructed on post-op care. Recommend OTC analgesia as needed for pain.  fluocinonide  ointment (LIDEX ) 0.05 % - Mid Upper [place] Lip Apply 1 Application topically 2 (two) times daily as needed. To lip as needed Related Medications hydrocortisone  2.5 % ointment Apply topically 2 (two) times daily. To lesion on lip triamcinolone  acetonide (KENALOG ) 10 MG/ML injection 10 mg   Return in about 4 weeks (around 06/16/2024).  I, Haig Levan, Surg Tech III, am acting as scribe for Deneise Finlay, MD.   Documentation: I have reviewed the above documentation for accuracy and completeness, and I agree with the  above.  Deneise Finlay, MD

## 2024-05-19 NOTE — Patient Instructions (Signed)

## 2024-05-24 ENCOUNTER — Other Ambulatory Visit: Payer: Self-pay | Admitting: Internal Medicine

## 2024-05-25 ENCOUNTER — Ambulatory Visit

## 2024-06-06 ENCOUNTER — Ambulatory Visit

## 2024-06-07 ENCOUNTER — Encounter: Payer: Self-pay | Admitting: Internal Medicine

## 2024-06-20 ENCOUNTER — Encounter: Payer: Self-pay | Admitting: Internal Medicine

## 2024-06-20 ENCOUNTER — Telehealth: Payer: Self-pay

## 2024-06-20 NOTE — Telephone Encounter (Signed)
 This has been sent to our referral coordinator Olam Finder. Copied from CRM [id number]. Topic: Referral - Status >> Jun 20, 2024 10:28 AM Zenovia PARAS wrote: Reason for CRM: Roxie  from [facility] calling to get a stat on PA  for patient. Contact number is [phone]

## 2024-06-22 ENCOUNTER — Ambulatory Visit: Admitting: Dermatology

## 2024-07-28 ENCOUNTER — Encounter: Payer: Self-pay | Admitting: Internal Medicine

## 2024-07-28 ENCOUNTER — Ambulatory Visit: Admitting: Dermatology

## 2024-08-05 ENCOUNTER — Encounter: Payer: Self-pay | Admitting: Internal Medicine

## 2024-08-08 ENCOUNTER — Ambulatory Visit (INDEPENDENT_AMBULATORY_CARE_PROVIDER_SITE_OTHER): Admitting: Dermatology

## 2024-08-08 ENCOUNTER — Encounter: Payer: Self-pay | Admitting: Dermatology

## 2024-08-08 DIAGNOSIS — L280 Lichen simplex chronicus: Secondary | ICD-10-CM

## 2024-08-08 MED ORDER — FLUOCINONIDE 0.05 % EX OINT: 1.0000 | TOPICAL_OINTMENT | Freq: Two times a day (BID) | CUTANEOUS | 2 refills | Status: AC | PRN

## 2024-08-08 NOTE — Patient Instructions (Signed)

## 2024-08-08 NOTE — Progress Notes (Signed)
   Follow-Up Visit   Subjective  Derek Bullock is a 63 y.o. male who presents for the following: follow up for treatment of lichenoid dermatitis.  The patient has had two injections of Intralesional Kenalog  10mg /cc. He is also using lidex  cream and feels that it is helping.   Patient states that he feels like it has improved since his last visit.   The following portions of the chart were reviewed this encounter and updated as appropriate: medications, allergies, medical history  Review of Systems:  No other skin or systemic complaints except as noted in HPI or Assessment and Plan.  Objective  Well appearing patient in no apparent distress; mood and   A focused examination was performed of the following areas: lip  Relevant exam findings are noted in the Assessment and Plan.    Assessment & Plan   Lichenoid Dermatitis- Responding well to ILK and TCS- improved bu not at treatment goal - Recommend continue TCS (lidex )  - s/p 2 rounds of ILK 10 - will monitor for now.  - will refill lidex   LICHENOID DERMATITIS Mid Upper [place] Lip Patient declines injection at this time due to cream being effective.  fluocinonide  ointment (LIDEX ) 0.05 % - Mid Upper [place] Lip Apply 1 Application topically 2 (two) times daily as needed. To lip as needed  Return in about 6 months (around 02/08/2025) for lichenoid follow up.  LILLETTE Rollene Gobble, RN, am acting as scribe for RUFUS CHRISTELLA HOLY, MD .   Documentation: I have reviewed the above documentation for accuracy and completeness, and I agree with the above.  RUFUS CHRISTELLA HOLY, MD

## 2024-08-09 ENCOUNTER — Encounter: Payer: Self-pay | Admitting: Internal Medicine

## 2024-08-09 ENCOUNTER — Ambulatory Visit: Admitting: Internal Medicine

## 2024-08-09 VITALS — BP 120/60 | HR 57 | Temp 98.0°F | Ht 73.0 in | Wt 165.2 lb

## 2024-08-09 DIAGNOSIS — D649 Anemia, unspecified: Secondary | ICD-10-CM

## 2024-08-09 DIAGNOSIS — R109 Unspecified abdominal pain: Secondary | ICD-10-CM | POA: Diagnosis not present

## 2024-08-09 DIAGNOSIS — Z77128 Contact with and (suspected) exposure to other hazards in the physical environment: Secondary | ICD-10-CM

## 2024-08-09 DIAGNOSIS — E785 Hyperlipidemia, unspecified: Secondary | ICD-10-CM

## 2024-08-09 DIAGNOSIS — K769 Liver disease, unspecified: Secondary | ICD-10-CM

## 2024-08-09 DIAGNOSIS — R1010 Upper abdominal pain, unspecified: Secondary | ICD-10-CM

## 2024-08-09 DIAGNOSIS — K219 Gastro-esophageal reflux disease without esophagitis: Secondary | ICD-10-CM

## 2024-08-09 MED ORDER — OMEPRAZOLE 20 MG PO CPDR: 20.0000 mg | DELAYED_RELEASE_CAPSULE | Freq: Every day | ORAL | 3 refills | Status: DC

## 2024-08-09 NOTE — Assessment & Plan Note (Signed)
 Right flank pain, resolved, monitoring for kidney disease and cancer risk due to [facility] exposure   Right flank pain has resolved, but there are concerns about kidney disease and cancer risk due to [facility] exposure. A previous CT scan for kidney stones and cancer risk was denied by insurance. He has a family history of kidney cancer and personal exposure to [facility]. Reorder a CT scan focused on kidneys to assess for kidney cancer due to [facility] exposure and previous flank pain.

## 2024-08-09 NOTE — Progress Notes (Signed)
 ==============================  [facility]: [phone]   -- Medical Office Visit --  Patient: Derek Bullock      Age: 63 y.o.       Sex:  male  Date:   08/09/2024 Today's Healthcare Provider: Bernardino KANDICE Cone, MD  ==============================   Chief Complaint: Hyperlipidemia  Discussed the use of AI scribe software for clinical note transcription with the patient, who gave verbal consent to proceed.  History of Present Illness 63 year old male who presents for a regular follow-up visit.  He has a history of anemia for the past 1.5 to 2 years, with hemoglobin levels around 12 to 13. Previously, he had macrocytic anemia due to alcohol use, which improved after cessation of alcohol. He takes a multivitamin for people over 50 and two tablespoons of extra virgin olive oil daily. No blood loss in stool is reported.  He has a history of liver damage and stopped drinking alcohol two years ago. He feels better and experiences less anxiety since stopping alcohol.  He mentions occasional back pain that has been improving and does not require muscle relaxers or ibuprofen. No current back pain is reported.  He has nodules on his hands, which he attributes to painting, but has not seen a hand specialist. Some loss of function is reported.  He experienced flank pain in May, which has since resolved. He was unable to get a CT scan approved to check for kidney stones or other issues. He has a family history of kidney cancer; his father had kidney cancer and was exposed to contaminated water at [facility].  He reports well-controlled heartburn with the use of Prilosec or omeprazole , but experiences recurrence of symptoms if he stops the medication. He is unsure of the exact dosage but believes it might be 40 mg.  He has a history of colon polyps and is due for a Cologuard test in April 2027.  He has a history of pneumonia and smoking but has  avoided pneumonia vaccinations due to adverse reactions.  He has been using extra virgin olive oil daily and possibly fish oil to manage cholesterol. His cholesterol was last checked in May.  He had a skin rash in May, which resolved on its own without treatment.  He had a history of limping observed in May, possibly related to past knee surgery for torn cartilage in the 1990s. He reports no current issues requiring intervention.  He had a toenail fungal infection that has mostly resolved.  He reported palpitations in February, which have since resolved.  Reviewed anemia with patient. Lab Results  Component Value Date/Time   HGB 12.8 (L) 05/09/2024 10:22 AM   HGB 13.1 (L) 09/07/2023 09:59 AM   HGB 12.2 (L) 04/08/2023 10:59 AM   HGB 12.9 (L) 03/06/2023 10:15 AM   HGB 13.8 11/24/2022 10:14 AM   Lab Results  Component Value Date/Time   MCV 93.3 05/09/2024 10:22 AM   MCV 94.1 09/07/2023 09:59 AM   MCV 94.6 04/08/2023 10:59 AM   MCV 94.7 03/06/2023 10:15 AM   MCV 100.8 (H) 11/24/2022 10:14 AM      Component Value Date/Time   IRON 68 09/07/2023 0949   TIBC 316.4 09/07/2023 0949   FERRITIN 34.4 09/07/2023 0949   FERRITIN 34.4 09/07/2023 0949   IRONPCTSAT 21.5 09/07/2023 0949    Lab Results  Component Value Date   RETICCTPCT 1.0 09/07/2023   RETICCTABS 40,600 09/07/2023   PATHREVIEW  09/07/2023  Comment:     Myeloid population consists predominantly of mature segmented neutrophils. A few lymphocytes appear reactive. Review of the peripheral smear reveals adequate numbers of platelets. Normocytic anemia with a few Elliptocytes. Reviewed by Barabara MOTE Sharron, MD  (Electronic Signature on File)     09/08/2023   He is taking multivitamin    Lab Results  Component Value Date   GFR 95.75 05/09/2024   GFR 93.33 03/06/2023   GFR 94.89 11/24/2022     Lab Results  Component Value Date   CHOL 158 05/11/2024   CHOL 219 (H) 11/24/2022   HDL 39.50 05/11/2024    HDL 113.20 11/24/2022   CHOLHDL 4 05/11/2024   CHOLHDL 2 11/24/2022   LDLCALC 101 (H) 05/11/2024   LDLCALC 96 11/24/2022   TRIG 86.0 05/11/2024   TRIG 48.0 11/24/2022   VLDL 17.2 05/11/2024   VLDL 9.6 11/24/2022   The 10-year ASCVD risk score (Arnett DK, et al., 2019) is: 9.5%   Values used to calculate the score:     Age: 89 years     Clincally relevant sex: Male     Is Non-Hispanic African American: No     Diabetic: No     Tobacco smoker: No     Systolic Blood Pressure: 120 mmHg     Is BP treated: No     HDL Cholesterol: 39.5 mg/dL     Total Cholesterol: 158 mg/dL    Updated Problem List Entries: Problem  Anemia   Anemia (ICD-10: D64.9) - Updated: Lab results from 09/07/2023 show persistent mild normocytic anemia (Hgb 13.1 g/dL, MCV 05.8 fL). Comprehensive workup including iron studies, B12, folate, and hemolysis markers are within normal limits. Peripheral smear shows a few elliptocytes. Current assessment suggests possible anemia of chronic disease vs. early myelodysplastic syndrome. Plan includes repeating CBC in 3 months and considering hematology referral if anemia persists or worsens.  Lab Results  Component Value Date/Time   HGB 13.1 (L) 09/07/2023 09:59 AM   HGB 12.2 (L) 04/08/2023 10:59 AM   HGB 12.9 (L) 03/06/2023 10:15 AM   HGB 13.8 11/24/2022 10:14 AM   HGB 14.6 05/11/2015 03:02 PM   Lab Results  Component Value Date/Time   MCV 94.1 09/07/2023 09:59 AM   MCV 94.6 04/08/2023 10:59 AM   MCV 94.7 03/06/2023 10:15 AM   MCV 100.8 (H) 11/24/2022 10:14 AM   MCV 100.0 05/11/2015 03:02 PM      Component Value Date/Time   IRON 68 09/07/2023 0949   TIBC 316.4 09/07/2023 0949   FERRITIN 34.4 09/07/2023 0949   FERRITIN 34.4 09/07/2023 0949   IRONPCTSAT 21.5 09/07/2023 0949    Lab Results  Component Value Date   RETICCTPCT 1.0 09/07/2023   RETICCTABS 40,600 09/07/2023   PATHREVIEW  09/07/2023     Comment:     Myeloid population consists predominantly of  mature segmented neutrophils. A few lymphocytes appear reactive. Review of the peripheral smear reveals adequate numbers of platelets. Normocytic anemia with a few Elliptocytes. Reviewed by Barabara MOTE Sharron, MD  (Electronic Signature on File)     09/08/2023            (optional):1}  Background Reviewed: Problem List: has History of colon polyps; Palpitations; Limping; Distal interphalangeal nodule; History of smoking 25-50 pack years; History of pneumonia; GERD (gastroesophageal reflux disease); Hyperlipidemia, acquired; Flank pain; Anemia; Onychomycosis; Hand arthritis; Exposure to environmental toxic substances; Chronic right-sided low back pain with right-sided sciatica; and Lichenoid dermatitis on their problem list. Past  Medical History:  has a past medical history of Adenomatous colon polyp, Alcohol dependence with other alcohol-induced disorder (HCC) (11/24/2022), Arrhythmia (12/22/2005), Arthritis, Bilateral impacted cerumen (11/24/2022), Cerumen impaction (12/31/2022), Cirrhosis of liver (HCC) (11/24/2022), Diverticulosis, GERD (gastroesophageal reflux disease), Gynecomastia, male (05/11/2015), Hemorrhoid (12/22/1998), Hyperlipidemia, Internal and external hemorrhoids without complication, Limping (11/24/2022), Lip lesion (09/08/2023), RUQ pain (03/06/2023), Tinnitus of right ear (11/24/2022), and Tobacco abuse (11/22/2012). Past Surgical History:   has a past surgical history that includes Knee surgery (Left, 1996) and Cardioversion (2007). Social History:   reports that he quit smoking about 12 months ago. His smoking use included cigarettes. He started smoking about 45 years ago. He has a 33.9 pack-year smoking history. He has never used smokeless tobacco. He reports current alcohol use of about 4.0 standard drinks of alcohol per week. He reports current drug use. Drug: Marijuana. Family History:  family history includes Arthritis in his mother; Cancer in his father; Colon  cancer in his paternal grandmother; Colon polyps in his brother; Diabetes in his mother; Glaucoma in his father; Heart attack in his mother; Heart disease in his father and mother; Kidney disease in his father. Allergies:  is allergic to codeine.   Medication Reconciliation: Current Outpatient Medications on File Prior to Visit  Medication Sig   Camphor-Menthol-Methyl Sal (SALONPAS ) 3.12-27-08 % PTCH Apply 1 Act topically daily at 6 (six) AM.   ciclopirox  (LOPROX ) 0.77 % cream Apply topically 2 (two) times daily.   cyanocobalamin  (VITAMIN B12) 1000 MCG/ML injection Inject into the muscle.   diclofenac  Sodium (VOLTAREN ) 1 % GEL Apply 4 g topically 4 (four) times daily as needed.   famotidine -calcium  carbonate-magnesium  hydroxide (PEPCID  COMPLETE) 10-800-165 MG chewable tablet Chew 1 tablet by mouth daily as needed (take these only as needed for heartburn acid symptoms).   fluocinonide  ointment (LIDEX ) 0.05 % Apply 1 Application topically 2 (two) times daily as needed. To lip as needed   methocarbamol  (ROBAXIN ) 500 MG tablet Take 1 tablet (500 mg total) by mouth every 6 (six) hours as needed for muscle spasms. Muscle relaxer for back pain   Multiple Vitamins-Minerals (ONE DAILY FOR MEN 50+ ADVANCED) TABS Take 1 tablet by mouth daily.   multivitamin (ONE-A-DAY MEN'S) TABS tablet Take 1 tablet by mouth daily.   thiamine  (VITAMIN B1) 100 MG tablet Take 1 tablet (100 mg total) by mouth daily.   No current facility-administered medications on file prior to visit.  There are no discontinued medications.   Physical Exam:    08/09/2024   10:04 AM 08/08/2024   11:24 AM 05/19/2024    2:23 PM  Vitals with BMI  Height 6' 1    Weight 165 lbs 3 oz    BMI 21.8    Systolic 120 113 899  Diastolic 60 68 59  Pulse 57 78 63  Vital signs reviewed.  Nursing notes reviewed. Weight trend reviewed. Physical Activity: Not on file   General Appearance:  No acute distress appreciable.   Well-groomed,  healthy-appearing male.  Well proportioned with no abnormal fat distribution.  Good muscle tone. Pulmonary:  Normal work of breathing at rest, no respiratory distress apparent. SpO2: 97 %  Musculoskeletal: All extremities are intact.  Neurological:  Awake, alert, oriented, and engaged.  No obvious focal neurological deficits or cognitive impairments.  Sensorium seems unclouded.   Speech is clear and coherent with logical content. Psychiatric:  Appropriate mood, pleasant and cooperative demeanor, thoughtful and engaged during the exam   Results:    02/09/2024  9:34 AM 11/09/2023   10:20 AM 09/07/2023    9:16 AM 07/27/2023    9:24 AM  PHQ 2/9 Scores  PHQ - 2 Score 0 0 0 0    Verbalized to patient: Results LABS Hemoglobin: 12-13 g/dL HDL: 39 mg/dL (94/7974)    No results found for any visits on 08/09/24. Lab on 05/11/2024  Component Date Value Ref Range Status   Cholesterol 05/11/2024 158  0 - 200 mg/dL Final   Triglycerides 94/78/7974 86.0  0.0 - 149.0 mg/dL Final   HDL 94/78/7974 39.50  >39.00 mg/dL Final   VLDL 94/78/7974 17.2  0.0 - 40.0 mg/dL Final   LDL Cholesterol 05/11/2024 101 (H)  0 - 99 mg/dL Final   Total CHOL/HDL Ratio 05/11/2024 4   Final   NonHDL 05/11/2024 118.12   Final  Office Visit on 05/09/2024  Component Date Value Ref Range Status   WBC 05/09/2024 7.3  4.0 - 10.5 K/uL Final   RBC 05/09/2024 4.03 (L)  4.22 - 5.81 Mil/uL Final   Hemoglobin 05/09/2024 12.8 (L)  13.0 - 17.0 g/dL Final   HCT 94/80/7974 37.6 (L)  39.0 - 52.0 % Final   MCV 05/09/2024 93.3  78.0 - 100.0 fl Final   MCHC 05/09/2024 34.1  30.0 - 36.0 g/dL Final   RDW 94/80/7974 13.6  11.5 - 15.5 % Final   Platelets 05/09/2024 300.0  150.0 - 400.0 K/uL Final   Neutrophils Relative % 05/09/2024 56.1  43.0 - 77.0 % Final   Lymphocytes Relative 05/09/2024 34.1  12.0 - 46.0 % Final   Monocytes Relative 05/09/2024 7.6  3.0 - 12.0 % Final   Eosinophils Relative 05/09/2024 1.7  0.0 - 5.0 % Final    Basophils Relative 05/09/2024 0.5  0.0 - 3.0 % Final   Neutro Abs 05/09/2024 4.1  1.4 - 7.7 K/uL Final   Lymphs Abs 05/09/2024 2.5  0.7 - 4.0 K/uL Final   Monocytes Absolute 05/09/2024 0.6  0.1 - 1.0 K/uL Final   Eosinophils Absolute 05/09/2024 0.1  0.0 - 0.7 K/uL Final   Basophils Absolute 05/09/2024 0.0  0.0 - 0.1 K/uL Final   Sodium 05/09/2024 141  135 - 145 mEq/L Final   Potassium 05/09/2024 3.8  3.5 - 5.1 mEq/L Final   Chloride 05/09/2024 107  96 - 112 mEq/L Final   CO2 05/09/2024 29  19 - 32 mEq/L Final   Glucose, Bld 05/09/2024 105 (H)  70 - 99 mg/dL Final   BUN 94/80/7974 10  6 - 23 mg/dL Final   Creatinine, Ser 05/09/2024 0.76  0.40 - 1.50 mg/dL Final   Total Bilirubin 05/09/2024 0.4  0.2 - 1.2 mg/dL Final   Alkaline Phosphatase 05/09/2024 80  39 - 117 U/L Final   AST 05/09/2024 16  0 - 37 U/L Final   ALT 05/09/2024 12  0 - 53 U/L Final   Total Protein 05/09/2024 6.2  6.0 - 8.3 g/dL Final   Albumin 94/80/7974 4.0  3.5 - 5.2 g/dL Final   GFR 94/80/7974 95.75  >60.00 mL/min Final   Calcium  05/09/2024 9.1  8.4 - 10.5 mg/dL Final   Color, Urine 94/80/7974 YELLOW  YELLOW Final   APPearance 05/09/2024 CLEAR  CLEAR Final   Specific Gravity, Urine 05/09/2024 1.006  1.001 - 1.035 Final   pH 05/09/2024 6.5  5.0 - 8.0 Final   Glucose, UA 05/09/2024 NEGATIVE  NEGATIVE Final   Bilirubin Urine 05/09/2024 NEGATIVE  NEGATIVE Final   Ketones, ur 05/09/2024 NEGATIVE  NEGATIVE  Final   Hgb urine dipstick 05/09/2024 NEGATIVE  NEGATIVE Final   Protein, ur 05/09/2024 NEGATIVE  NEGATIVE Final   Nitrites, Initial 05/09/2024 NEGATIVE  NEGATIVE Final   Leukocyte Esterase 05/09/2024 2+ (A)  NEGATIVE Final   WBC, UA 05/09/2024 0-5  0 - 5 /HPF Final   RBC / HPF 05/09/2024 NONE SEEN  0 - 2 /HPF Final   Squamous Epithelial / HPF 05/09/2024 0-5  < OR = 5 /HPF Final   Bacteria, UA 05/09/2024 NONE SEEN  NONE SEEN /HPF Final   Hyaline Cast 05/09/2024 NONE SEEN  NONE SEEN /LPF Final   Note 05/09/2024     Final   Extra Urine Specimen 05/09/2024    Final   MICRO NUMBER: 05/09/2024 [id number]   Final   SPECIMEN QUALITY: 05/09/2024 Adequate   Final   Sample Source 05/09/2024 URINE   Final   STATUS: 05/09/2024 FINAL   Final   Result: 05/09/2024 Less than 10,000 CFU/mL of single Gram positive organism isolated. No further testing will be performed. If clinically indicated, recollection using a method to minimize contamination, with prompt transfer to Urine Culture Transport Tube, is recommended.   Final   REFLEXIVE URINE CULTURE 05/09/2024    Final  Office Visit on 12/22/2023  Component Date Value Ref Range Status   SURGICAL PATHOLOGY 12/22/2023    Final-Edited                   Value:SURGICAL PATHOLOGY [facility] [address] [place], [place] [address] Telephone [phone] or [phone] Fax [phone]  REPORT OF DERMATOPATHOLOGY   Accession #: [id number] Patient Name: SHAWNEE, HIGHAM Visit # : [id number]  MRN: [id number] Cytotechnologist: Santa Md, Eleanor, Dermatopathologist, Electronic Signature DOB/Age 10-Dec-1961 (Age: 40) Gender: M Collected Date: 12/22/2023 Received Date: 12/22/2023  FINAL DIAGNOSIS       1. Skin, mid upper vermilion lip :       LICHENOID DERMATITIS, SEE DESCRIPTION       DATE SIGNED OUT: 12/25/2023 ELECTRONIC SIGNATURE : Munoz-Bishop Md, Melissa, Dermatopathologist, Electronic Signature  MICROSCOPIC DESCRIPTION 1. There is a patchy lichenoid lymphocytic infiltrate that focally obscures the basement membranes where there is vacuolar change and a few necrotic keratinocytes.  There is hyperplasia of the squamous epithelium.  The subepithelial tissue shows some fibr                         oplasia with a sparse lymphocytic infiltrate. These changes are suggestive of chronic external irritation. There is no significant atypia or malignancy.  Following review of the hematoxylin and eosin sections, a PAS stain  was obtained to exclude a fungal infection. The PAS stain is negative for fungal organisms.  No bacteria are seen with a Gram stain. Multiple levels taken through the submitted block are examined. The findings are those of a lichenoid dermatitis which is not completely diagnostic.  The differential diagnosis includes lichen planus and less specific lichenoid eruptions, such as secondary to drugs.  CASE COMMENTS STAINS USED IN DIAGNOSIS: H&E H&E *RECUT DEEPER X 6 LEVELS *RECUT DEEPER X 6 LEVELS *RECUT DEEPER X 6 LEVELS *RECUT DEEPER X 6 LEVELS Stains used in diagnosis 1 *PAS/F Stain, 1 *PAS/F Stain, 1 Gram +/- Stain, 1 Gram +/- Stain    CLINICAL HISTORY  SPECIMEN(S) OBTAINED 1. Skin, Mid Upper Vermilion Lip  SPECIMEN COMMENTS: SPECIMEN CLIN  ICAL INFORMATION: 1. Neoplasm of uncertain behavior of skin, SCC vs other    Gross Description 1. Formalin fixed specimen received:  10 X 7 X 1 MM, TOTO (6 P) (1 B) ( qb )=a      Additional formalin fixed specimen received in bottle:  8 X 7 X 1 MM, TOTO (3 P)      (1 B) = b    ( qb )        Report signed out from the following location(s) [place] [address] [place], [place] [address] CLIA #: [id number]  [facility] [address] [place], [place] [address] CLIA #: [id number]   Office Visit on 09/07/2023  Component Date Value Ref Range Status   CRP 09/07/2023 <1.0  0.5 - 20.0 mg/dL Final   Sed Rate 90/83/7975 5  0 - 20 mm/hr Final   Folate 09/07/2023 >24.2  >5.9 ng/mL Final   Vitamin B-12 09/07/2023 404  211 - 911 pg/mL Final   Ferritin 09/07/2023 34.4  22.0 - 322.0 ng/mL Final   DAT, Polyspecific 09/07/2023 NEGATIVE  NEGATIVE Final   Haptoglobin 09/07/2023 203  43 - 212 mg/dL Final   LDH 90/83/7975 121  120 - 250 U/L Final   TSH 09/07/2023 1.950  0.450 - 4.500 uIU/mL Final   WBC 09/07/2023 6.6  3.8 - 10.8 Thousand/uL Final   RBC 09/07/2023 4.08 (L)  4.20  - 5.80 Million/uL Final   Hemoglobin 09/07/2023 13.1 (L)  13.2 - 17.1 g/dL Final   HCT 90/83/7975 38.4 (L)  38.5 - 50.0 % Final   MCV 09/07/2023 94.1  80.0 - 100.0 fL Final   MCH 09/07/2023 32.1  27.0 - 33.0 pg Final   MCHC 09/07/2023 34.1  32.0 - 36.0 g/dL Final   RDW 90/83/7975 12.9  11.0 - 15.0 % Final   Platelets 09/07/2023 318  140 - 400 Thousand/uL Final   MPV 09/07/2023 9.6  7.5 - 12.5 fL Final   Path Review 09/07/2023    Final   Iron 09/07/2023 68  42 - 165 ug/dL Final   Transferrin 90/83/7975 226.0  212.0 - 360.0 mg/dL Final   Saturation Ratios 09/07/2023 21.5  20.0 - 50.0 % Final   Ferritin 09/07/2023 34.4  22.0 - 322.0 ng/mL Final   TIBC 09/07/2023 316.4  250.0 - 450.0 mcg/dL Final   Retic Ct Pct 90/83/7975 1.0  % Final   ABS Retic 09/07/2023 40,600  25,000 - 90,000 cells/uL Final   Path Review 09/07/2023    Final  Lab on 06/01/2023  Component Date Value Ref Range Status   Color, Urine 06/01/2023 YELLOW  Yellow;Lt. Yellow;Straw;Dark Yellow;Amber;Green;Red;Brown Final   APPearance 06/01/2023 CLEAR  Clear;Turbid;Slightly Cloudy;Cloudy Final   Specific Gravity, Urine 06/01/2023 1.025  1.000 - 1.030 Final   pH 06/01/2023 6.0  5.0 - 8.0 Final   Total Protein, Urine 06/01/2023 NEGATIVE  Negative Final   Urine Glucose 06/01/2023 NEGATIVE  Negative Final   Ketones, ur 06/01/2023 NEGATIVE  Negative Final   Bilirubin Urine 06/01/2023 NEGATIVE  Negative Final   Hgb urine dipstick 06/01/2023 NEGATIVE  Negative Final   Urobilinogen, UA 06/01/2023 0.2  0.0 - 1.0 Final   Leukocytes,Ua 06/01/2023 NEGATIVE  Negative Final   Nitrite 06/01/2023 NEGATIVE  Negative Final   WBC, UA 06/01/2023 0-2/hpf  0-2/hpf Final   RBC / HPF 06/01/2023 none seen  0-2/hpf Final   Squamous Epithelial / HPF 06/01/2023 Rare(0-4/hpf)  Rare(0-4/hpf) Final  Lab on 04/09/2023  Component Date Value Ref Range Status   Fecal Occult Bld 04/09/2023 Negative  Negative Final  Lab on 04/08/2023  Component Date Value  Ref Range Status   Color, Urine 04/08/2023 YELLOW  Yellow;Lt. Yellow;Straw;Dark Yellow;Amber;Green;Red;Brown Final   APPearance 04/08/2023 CLEAR  Clear;Turbid;Slightly Cloudy;Cloudy Final   Specific Gravity, Urine 04/08/2023 1.020  1.000 - 1.030 Final   pH 04/08/2023 7.0  5.0 - 8.0 Final   Total Protein, Urine 04/08/2023 NEGATIVE  Negative Final   Urine Glucose 04/08/2023 NEGATIVE  Negative Final   Ketones, ur 04/08/2023 NEGATIVE  Negative Final   Bilirubin Urine 04/08/2023 NEGATIVE  Negative Final   Hgb urine dipstick 04/08/2023 NEGATIVE  Negative Final   Urobilinogen, UA 04/08/2023 1.0  0.0 - 1.0 Final   Leukocytes,Ua 04/08/2023 TRACE (A)  Negative Final   Nitrite 04/08/2023 NEGATIVE  Negative Final   WBC, UA 04/08/2023 3-6/hpf (A)  0-2/hpf Final   RBC / HPF 04/08/2023 0-2/hpf  0-2/hpf Final   Mucus, UA 04/08/2023 Presence of (A)  None Final   Squamous Epithelial / HPF 04/08/2023 Rare(0-4/hpf)  Rare(0-4/hpf) Final   WBC 04/08/2023 6.3  4.0 - 10.5 K/uL Final   RBC 04/08/2023 3.74 (L)  4.22 - 5.81 Mil/uL Final   Hemoglobin 04/08/2023 12.2 (L)  13.0 - 17.0 g/dL Final   HCT 95/82/7975 35.4 (L)  39.0 - 52.0 % Final   MCV 04/08/2023 94.6  78.0 - 100.0 fl Final   MCHC 04/08/2023 34.6  30.0 - 36.0 g/dL Final   RDW 95/82/7975 14.1  11.5 - 15.5 % Final   Platelets 04/08/2023 325.0  150.0 - 400.0 K/uL Final   Neutrophils Relative % 04/08/2023 48.8  43.0 - 77.0 % Final   Lymphocytes Relative 04/08/2023 37.9  12.0 - 46.0 % Final   Monocytes Relative 04/08/2023 10.1  3.0 - 12.0 % Final   Eosinophils Relative 04/08/2023 2.4  0.0 - 5.0 % Final   Basophils Relative 04/08/2023 0.8  0.0 - 3.0 % Final   Neutro Abs 04/08/2023 3.1  1.4 - 7.7 K/uL Final   Lymphs Abs 04/08/2023 2.4  0.7 - 4.0 K/uL Final   Monocytes Absolute 04/08/2023 0.6  0.1 - 1.0 K/uL Final   Eosinophils Absolute 04/08/2023 0.2  0.0 - 0.7 K/uL Final   Basophils Absolute 04/08/2023 0.0  0.0 - 0.1 K/uL Final  Office Visit on 03/27/2023   Component Date Value Ref Range Status   Color, Urine 03/27/2023 DARK YELLOW  YELLOW Final   APPearance 03/27/2023 CLEAR  CLEAR Final   Specific Gravity, Urine 03/27/2023 1.018  1.001 - 1.035 Final   pH 03/27/2023 7.5  5.0 - 8.0 Final   Glucose, UA 03/27/2023 NEGATIVE  NEGATIVE Final   Bilirubin Urine 03/27/2023 NEGATIVE  NEGATIVE Final   Ketones, ur 03/27/2023 NEGATIVE  NEGATIVE Final   Hgb urine dipstick 03/27/2023 NEGATIVE  NEGATIVE Final   Protein, ur 03/27/2023 NEGATIVE  NEGATIVE Final   Nitrite 03/27/2023 NEGATIVE  NEGATIVE Final   Leukocytes,Ua 03/27/2023 NEGATIVE  NEGATIVE Final   WBC, UA 03/27/2023 6-10 (A)  0 - 5 /HPF Final   RBC / HPF 03/27/2023 NONE SEEN  0 - 2 /HPF Final   Squamous Epithelial / HPF 03/27/2023 0-5  < OR = 5 /HPF Final   Bacteria, UA 03/27/2023 NONE SEEN  NONE SEEN /HPF Final   Hyaline Cast 03/27/2023 NONE SEEN  NONE SEEN /LPF Final   Note 03/27/2023    Final  Office Visit on 03/06/2023  Component Date Value Ref Range Status  COLOGUARD 04/11/2023 Negative  Negative Final   Color, Urine 03/06/2023 YELLOW  YELLOW Final   APPearance 03/06/2023 CLEAR  CLEAR Final   Specific Gravity, Urine 03/06/2023 1.015  1.001 - 1.035 Final   pH 03/06/2023 6.5  5.0 - 8.0 Final   Glucose, UA 03/06/2023 NEGATIVE  NEGATIVE Final   Bilirubin Urine 03/06/2023 NEGATIVE  NEGATIVE Final   Ketones, ur 03/06/2023 NEGATIVE  NEGATIVE Final   Hgb urine dipstick 03/06/2023 NEGATIVE  NEGATIVE Final   Protein, ur 03/06/2023 NEGATIVE  NEGATIVE Final   Nitrite 03/06/2023 NEGATIVE  NEGATIVE Final   Leukocytes,Ua 03/06/2023 NEGATIVE  NEGATIVE Final   WBC, UA 03/06/2023 NONE SEEN  0 - 5 /HPF Final   RBC / HPF 03/06/2023 NONE SEEN  0 - 2 /HPF Final   Squamous Epithelial / HPF 03/06/2023 0-5  < OR = 5 /HPF Final   Bacteria, UA 03/06/2023 NONE SEEN  NONE SEEN /HPF Final   Hyaline Cast 03/06/2023 NONE SEEN  NONE SEEN /LPF Final   Note 03/06/2023    Final   Sodium 03/06/2023 141  135 - 145  mEq/L Final   Potassium 03/06/2023 4.3  3.5 - 5.1 mEq/L Final   Chloride 03/06/2023 107  96 - 112 mEq/L Final   CO2 03/06/2023 29  19 - 32 mEq/L Final   Glucose, Bld 03/06/2023 90  70 - 99 mg/dL Final   BUN 96/84/7975 11  6 - 23 mg/dL Final   Creatinine, Ser 03/06/2023 0.85  0.40 - 1.50 mg/dL Final   Total Bilirubin 03/06/2023 0.5  0.2 - 1.2 mg/dL Final   Alkaline Phosphatase 03/06/2023 70  39 - 117 U/L Final   AST 03/06/2023 15  0 - 37 U/L Final   ALT 03/06/2023 13  0 - 53 U/L Final   Total Protein 03/06/2023 6.3  6.0 - 8.3 g/dL Final   Albumin 96/84/7975 3.9  3.5 - 5.2 g/dL Final   GFR 96/84/7975 93.33  >60.00 mL/min Final   Calcium  03/06/2023 9.6  8.4 - 10.5 mg/dL Final   WBC 96/84/7975 6.0  4.0 - 10.5 K/uL Final   RBC 03/06/2023 3.93 (L)  4.22 - 5.81 Mil/uL Final   Platelets 03/06/2023 326.0  150.0 - 400.0 K/uL Final   Hemoglobin 03/06/2023 12.9 (L)  13.0 - 17.0 g/dL Final   HCT 96/84/7975 37.2 (L)  39.0 - 52.0 % Final   MCV 03/06/2023 94.7  78.0 - 100.0 fl Final   MCHC 03/06/2023 34.6  30.0 - 36.0 g/dL Final   RDW 96/84/7975 13.7  11.5 - 15.5 % Final   Amylase 03/06/2023 31  27 - 131 U/L Final   Lipase 03/06/2023 15.0  11.0 - 59.0 U/L Final  Office Visit on 11/24/2022  Component Date Value Ref Range Status   WBC 11/24/2022 5.0  4.0 - 10.5 K/uL Final   RBC 11/24/2022 4.02 (L)  4.22 - 5.81 Mil/uL Final   Hemoglobin 11/24/2022 13.8  13.0 - 17.0 g/dL Final   HCT 87/95/7976 40.5  39.0 - 52.0 % Final   MCV 11/24/2022 100.8 (H)  78.0 - 100.0 fl Final   MCHC 11/24/2022 34.1  30.0 - 36.0 g/dL Final   RDW 87/95/7976 14.0  11.5 - 15.5 % Final   Platelets 11/24/2022 265.0  150.0 - 400.0 K/uL Final   Neutrophils Relative % 11/24/2022 46.2  43.0 - 77.0 % Final   Lymphocytes Relative 11/24/2022 35.0  12.0 - 46.0 % Final   Monocytes Relative 11/24/2022 15.7 (H)  3.0 -  12.0 % Final   Eosinophils Relative 11/24/2022 1.9  0.0 - 5.0 % Final   Basophils Relative 11/24/2022 1.2  0.0 - 3.0 %  Final   Neutro Abs 11/24/2022 2.3  1.4 - 7.7 K/uL Final   Lymphs Abs 11/24/2022 1.7  0.7 - 4.0 K/uL Final   Monocytes Absolute 11/24/2022 0.8  0.1 - 1.0 K/uL Final   Eosinophils Absolute 11/24/2022 0.1  0.0 - 0.7 K/uL Final   Basophils Absolute 11/24/2022 0.1  0.0 - 0.1 K/uL Final   Sodium 11/24/2022 134 (L)  135 - 145 mEq/L Final   Potassium 11/24/2022 5.1  3.5 - 5.1 mEq/L Final   Chloride 11/24/2022 98  96 - 112 mEq/L Final   CO2 11/24/2022 28  19 - 32 mEq/L Final   Glucose, Bld 11/24/2022 82  70 - 99 mg/dL Final   BUN 87/95/7976 6  6 - 23 mg/dL Final   Creatinine, Ser 11/24/2022 0.81  0.40 - 1.50 mg/dL Final   Total Bilirubin 11/24/2022 0.4  0.2 - 1.2 mg/dL Final   Alkaline Phosphatase 11/24/2022 81  39 - 117 U/L Final   AST 11/24/2022 51 (H)  0 - 37 U/L Final   ALT 11/24/2022 44  0 - 53 U/L Final   Total Protein 11/24/2022 7.0  6.0 - 8.3 g/dL Final   Albumin 87/95/7976 4.5  3.5 - 5.2 g/dL Final   GFR 87/95/7976 94.89  >60.00 mL/min Final   Calcium  11/24/2022 9.6  8.4 - 10.5 mg/dL Final   Cholesterol 87/95/7976 219 (H)  0 - 200 mg/dL Final   Triglycerides 87/95/7976 48.0  0.0 - 149.0 mg/dL Final   HDL 87/95/7976 113.20  >39.00 mg/dL Final   VLDL 87/95/7976 9.6  0.0 - 59.9 mg/dL Final   LDL Cholesterol 11/24/2022 96  0 - 99 mg/dL Final   Total CHOL/HDL Ratio 11/24/2022 2   Final   NonHDL 11/24/2022 105.94   Final  There may be more visits with results that are not included.  No image results found. No results found.       ASSESSMENT & PLAN   Assessment & Plan Anemia, unspecified type Anemia of unclear etiology, stable, monitoring for vitamin deficiency and chronic disease   He has mild anemia with stable hemoglobin levels around 12-13 over the last 1.5 to 2 years. Previously linked to alcohol use causing macrocytosis, it may now be due to vitamin deficiencies or a chronic inflammatory condition. Iron levels are adequate, and there is no overt bleeding. Past liver damage may  contribute. Recheck blood work, including blood counts, B12, renal panel, thyroid, liver function tests, and occult blood within 3 months if not done today. Additional Labs to Consider every 6 months  CBC with differential: Already done. B12 and folate Renal panel TSH Liver function tests Occult blood in stool Exposure to environmental toxic substances Flank pain Pain of upper abdomen Right flank pain, resolved, monitoring for kidney disease and cancer risk due to [facility] exposure   Right flank pain has resolved, but there are concerns about kidney disease and cancer risk due to [facility] exposure. A previous CT scan for kidney stones and cancer risk was denied by insurance. He has a family history of kidney cancer and personal exposure to [facility]. Reorder a CT scan focused on kidneys to assess for kidney cancer due to [facility] exposure and previous flank pain. Gastroesophageal reflux disease, unspecified whether esophagitis present Gastroesophageal reflux disease (GERD), ongoing PPI therapy, attempt to reduce dose  GERD is well-controlled with omeprazole , but attempts to discontinue result in symptom recurrence. The current dose is likely 40 mg. Long-term PPI use can affect vitamin absorption and is associated with increased dementia risk. Prescribe omeprazole  20 mg to attempt dose reduction and educate on risks of long-term PPI use, including vitamin deficiency and potential dementia risk. 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 Hyperlipidemia, unspecified hyperlipidemia type Hyperlipidemia, monitoring, lifestyle modification   Hyperlipidemia with previous HDL fluctuation is being managed with lifestyle modifications, including extra virgin olive oil and potential fish oil supplementation. The last cholesterol check in  May showed decent levels, but HDL variability was noted. He has a 10% risk of cardiovascular disease in the next ten years due to age. Continue lifestyle modifications with extra virgin olive oil and consider fish oil. Recheck cholesterol in 3 months with fasting blood work.   ORDER ASSOCIATIONS  #   DIAGNOSIS / CONDITION ICD-10 ENCOUNTER ORDER     ICD-10-CM   1. Anemia, unspecified type  D64.9     2. Exposure to environmental toxic substances  Z77.128     3. Flank pain  R10.9     4. Pain of upper abdomen  R10.10 CT RENAL STONE STUDY    5. Gastroesophageal reflux disease, unspecified whether esophagitis present  K21.9 omeprazole  (PRILOSEC) 20 MG capsule    6. Chronic liver disease  K76.9     7. Hyperlipidemia, unspecified hyperlipidemia type  E78.5       Meds ordered this encounter  Medications   omeprazole  (PRILOSEC) 20 MG capsule    Sig: Take 1 capsule (20 mg total) by mouth daily.    Dispense:  30 capsule    Refill:  3    Orders Placed This Encounter  Procedures   CT RENAL STONE STUDY    Exposed to [place] that caused renal cancer in father; also has persistent right flank pain MCD Epic WT 165 No speical needs/No spinal cord/body injector (glucose monitor/port attach) PT aware of No Show Fee of $75    Standing Status:   Future    Expected Date:   08/09/2024    Expiration Date:   10/09/2024    Preferred imaging location?:   [facility]        This document was synthesized by artificial intelligence ([employer]bridge) using HIPAA-compliant recording of the clinical interaction;   We discussed the use of AI scribe software for clinical note transcription with the patient, who gave verbal consent to proceed. additional Info: This encounter employed state-of-the-art, real-time, collaborative documentation. The patient actively reviewed and assisted in updating their electronic medical record on a shared screen, ensuring transparency and facilitating joint  problem-solving for the problem list, overview, and plan. This approach promotes accurate, informed care. The treatment plan was discussed and reviewed in detail, including medication safety, potential side effects, and all patient questions. We confirmed understanding and comfort with the plan. Follow-up instructions were established, including contacting the office for any concerns, returning if symptoms worsen, persist, or new symptoms develop, and precautions for potential emergency department visits.

## 2024-08-09 NOTE — Assessment & Plan Note (Addendum)
 Anemia of unclear etiology, stable, monitoring for vitamin deficiency and chronic disease   He has mild anemia with stable hemoglobin levels around 12-13 over the last 1.5 to 2 years. Previously linked to alcohol use causing macrocytosis, it may now be due to vitamin deficiencies or a chronic inflammatory condition. Iron levels are adequate, and there is no overt bleeding. Past liver damage may contribute. Recheck blood work, including blood counts, B12, renal panel, thyroid, liver function tests, and occult blood within 3 months if not done today. Additional Labs to Consider every 6 months  CBC with differential: Already done. B12 and folate Renal panel TSH Liver function tests Occult blood in stool

## 2024-08-09 NOTE — Assessment & Plan Note (Signed)
 Gastroesophageal reflux disease (GERD), ongoing PPI therapy, attempt to reduce dose   GERD is well-controlled with omeprazole , but attempts to discontinue result in symptom recurrence. The current dose is likely 40 mg. Long-term PPI use can affect vitamin absorption and is associated with increased dementia risk. Prescribe omeprazole  20 mg to attempt dose reduction and educate on risks of long-term PPI use, including vitamin deficiency and potential dementia risk.

## 2024-08-09 NOTE — Patient Instructions (Signed)
 It was a pleasure seeing you today! Your health and satisfaction are our top priorities.  Bernardino Cone, MD  VISIT SUMMARY: During your follow-up visit, we reviewed your history of anemia, liver damage, back pain, hand nodules, flank pain, heartburn, colon polyps, pneumonia, cholesterol management, skin rash, knee surgery, toenail fungal infection, and palpitations. We discussed your current health status and made plans for ongoing monitoring and treatment adjustments.  YOUR PLAN: -ANEMIA: Anemia is a condition where you have fewer red blood cells than normal, which can cause fatigue and weakness. Your anemia is stable with hemoglobin levels around 12-13. We will recheck your blood work, including blood counts, B12, renal panel, thyroid, liver function tests, and occult blood within 3 months if not done today.  -ALCOHOL USE DISORDER IN SUSTAINED REMISSION: You have successfully stopped drinking alcohol for about two years, which has significantly reduced the risk of further liver damage. Continue to abstain from alcohol to maintain your health.  -CHRONIC LIVER DISEASE: Chronic liver disease is long-term damage to the liver, often due to alcohol use. Your condition is stable, and you are managing it well by not consuming alcohol.  -RIGHT FLANK PAIN: Your right flank pain has resolved, but due to your family history of kidney cancer and exposure to contaminated water at [facility], we need to monitor for kidney disease and cancer. We will reorder a CT scan focused on your kidneys to assess for any issues.  -GASTROESOPHAGEAL REFLUX DISEASE (GERD): GERD is a condition where stomach acid frequently flows back into the tube connecting your mouth and stomach, causing heartburn. Your GERD is well-controlled with omeprazole , but we will try reducing your dose to 20 mg to minimize long-term risks. Be aware that long-term use of PPIs can affect vitamin absorption and may increase the risk of  dementia.  -HYPERLIPIDEMIA: Hyperlipidemia is having high levels of fats (lipids) in your blood, which can increase the risk of heart disease. You are managing this with lifestyle changes, including using extra virgin olive oil and possibly fish oil. We will recheck your cholesterol in 3 months with fasting blood work.  INSTRUCTIONS: Please follow up with the recommended blood work and CT scan within the next 3 months. Continue your current lifestyle modifications and medication adjustments as discussed. If you experience any new symptoms or have concerns, schedule an appointment.  Your Providers PCP: Cone Bernardino MATSU, MD,  [phone]) Referring Provider: Cone Bernardino MATSU, MD,  [phone]) Care Team Provider: Fernande Elspeth BROCKS, MD,  [phone])  NEXT STEPS: [x]  Early Intervention: Schedule sooner appointment, call our on-call services, or go to emergency room if there is any significant Increase in pain or discomfort New or worsening symptoms Sudden or severe changes in your health [x]  Flexible Follow-Up: We recommend a Return in about 3 months (around 11/09/2024), or fasting, for chronic disease monitoring and management annual physical if insurance allows. for optimal routine care. This allows for progress monitoring and treatment adjustments. [x]  Preventive Care: Schedule your annual preventive care visit! It's typically covered by insurance and helps identify potential health issues early. [x]  Lab & X-ray Appointments: Incomplete tests scheduled today, or call to schedule. X-rays: [facility] (M-F, 8:30am-noon or 1pm-5pm). [x]  Medical Information Release: Sign a release form at front desk to obtain relevant medical information we don't have.  MAKING THE MOST OF OUR FOCUSED 20 MINUTE APPOINTMENTS: [x]   Clearly state your top concerns at the beginning of the visit to focus our discussion [x]   If you anticipate  you will need more time, please inform the front desk  during scheduling - we can book multiple appointments in the same week. [x]   If you have transportation problems- use our convenient video appointments or ask about transportation support. [x]   We can get down to business faster if you use MyChart to update information before the visit and submit non-urgent questions before your visit. Thank you for taking the time to provide details through MyChart.  Let our nurse know and she can import this information into your encounter documents.  Arrival and Wait Times: [x]   Arriving on time ensures that everyone receives prompt attention. [x]   Early morning (8a) and afternoon (1p) appointments tend to have shortest wait times. [x]   Unfortunately, we cannot delay appointments for late arrivals or hold slots during phone calls.  Getting Answers and Following Up [x]   Simple Questions & Concerns: For quick questions or basic follow-up after your visit, reach us  at [phone] or MyChart messaging. [x]   Complex Concerns: If your concern is more complex, scheduling an appointment might be best. Discuss this with the staff to find the most suitable option. [x]   Lab & Imaging Results: We'll contact you directly if results are abnormal or you don't use MyChart. Most normal results will be on MyChart within 2-3 business days, with a review message from Dr. Jesus. Haven't heard back in 2 weeks? Need results sooner? Contact us  at [phone]. [x]   Referrals: Our referral coordinator will manage specialist referrals. The specialist's office should contact you within 2 weeks to schedule an appointment. Call us  if you haven't heard from them after 2 weeks.  Staying Connected [x]   MyChart: Activate your MyChart for the fastest way to access results and message us . See the last page of this paperwork for instructions on how to activate.  Bring to Your Next Appointment [x]   Medications: Please bring all your medication bottles to your next appointment to ensure we  have an accurate record of your prescriptions. [x]   Health Diaries: If you're monitoring any health conditions at home, keeping a diary of your readings can be very helpful for discussions at your next appointment.  Billing [x]   X-ray & Lab Orders: These are billed by separate companies. Contact the invoicing company directly for questions or concerns. [x]   Visit Charges: Discuss any billing inquiries with our administrative services team.  Your Satisfaction Matters [x]   Share Your Experience: We strive for your satisfaction! If you have any complaints, or preferably compliments, please let Dr. Jesus know directly or contact our Practice Administrators, Manuelita Rubin or [employer], by asking at the front desk.   Reviewing Your Records [x]   Review this early draft of your clinical encounter notes below and the final encounter summary tomorrow on MyChart after its been completed.  All orders placed so far are visible here: Anemia, unspecified type Assessment & Plan: Additional Labs to Consider every 6 months  CBC with differential: Already done. B12 and folate Renal panel TSH Liver function tests Occult blood in stool   Exposure to environmental toxic substances  Flank pain  Pain of upper abdomen -     CT RENAL STONE STUDY; Future  Gastroesophageal reflux disease, unspecified whether esophagitis present -     Omeprazole ; Take 1 capsule (20 mg total) by mouth daily.  Dispense: 30 capsule; Refill: 3  Chronic liver disease  Hyperlipidemia, unspecified hyperlipidemia type

## 2024-08-19 ENCOUNTER — Other Ambulatory Visit

## 2024-08-24 ENCOUNTER — Other Ambulatory Visit

## 2024-09-09 ENCOUNTER — Other Ambulatory Visit

## 2024-09-23 ENCOUNTER — Other Ambulatory Visit

## 2024-11-14 ENCOUNTER — Encounter: Admitting: Internal Medicine

## 2024-12-16 ENCOUNTER — Other Ambulatory Visit: Payer: Self-pay | Admitting: Internal Medicine

## 2024-12-16 DIAGNOSIS — K219 Gastro-esophageal reflux disease without esophagitis: Secondary | ICD-10-CM

## 2024-12-20 ENCOUNTER — Encounter: Admitting: Internal Medicine

## 2025-01-11 ENCOUNTER — Encounter: Payer: Self-pay | Admitting: Internal Medicine

## 2025-01-11 ENCOUNTER — Ambulatory Visit: Admitting: Internal Medicine

## 2025-01-11 VITALS — BP 120/62 | HR 94 | Temp 98.0°F | Ht 73.0 in | Wt 166.0 lb

## 2025-01-11 DIAGNOSIS — Z87891 Personal history of nicotine dependence: Secondary | ICD-10-CM

## 2025-01-11 DIAGNOSIS — K219 Gastro-esophageal reflux disease without esophagitis: Secondary | ICD-10-CM

## 2025-01-11 DIAGNOSIS — J3489 Other specified disorders of nose and nasal sinuses: Secondary | ICD-10-CM | POA: Diagnosis not present

## 2025-01-11 DIAGNOSIS — Z0001 Encounter for general adult medical examination with abnormal findings: Secondary | ICD-10-CM

## 2025-01-11 DIAGNOSIS — E785 Hyperlipidemia, unspecified: Secondary | ICD-10-CM | POA: Diagnosis not present

## 2025-01-11 DIAGNOSIS — Z125 Encounter for screening for malignant neoplasm of prostate: Secondary | ICD-10-CM | POA: Diagnosis not present

## 2025-01-11 DIAGNOSIS — K7031 Alcoholic cirrhosis of liver with ascites: Secondary | ICD-10-CM | POA: Diagnosis not present

## 2025-01-11 DIAGNOSIS — J342 Deviated nasal septum: Secondary | ICD-10-CM

## 2025-01-11 LAB — COMPREHENSIVE METABOLIC PANEL WITH GFR
ALT: 10 U/L (ref 3–53)
AST: 13 U/L (ref 5–37)
Albumin: 4.1 g/dL (ref 3.5–5.2)
Alkaline Phosphatase: 86 U/L (ref 39–117)
BUN: 7 mg/dL (ref 6–23)
CO2: 31 meq/L (ref 19–32)
Calcium: 9.4 mg/dL (ref 8.4–10.5)
Chloride: 106 meq/L (ref 96–112)
Creatinine, Ser: 0.75 mg/dL (ref 0.40–1.50)
GFR: 95.68 mL/min
Glucose, Bld: 90 mg/dL (ref 70–99)
Potassium: 4.6 meq/L (ref 3.5–5.1)
Sodium: 141 meq/L (ref 135–145)
Total Bilirubin: 0.4 mg/dL (ref 0.2–1.2)
Total Protein: 6.2 g/dL (ref 6.0–8.3)

## 2025-01-11 LAB — CBC WITH DIFFERENTIAL/PLATELET
Basophils Absolute: 0 K/uL (ref 0.0–0.1)
Basophils Relative: 0.7 % (ref 0.0–3.0)
Eosinophils Absolute: 0.1 K/uL (ref 0.0–0.7)
Eosinophils Relative: 1.5 % (ref 0.0–5.0)
HCT: 37.9 % — ABNORMAL LOW (ref 39.0–52.0)
Hemoglobin: 13 g/dL (ref 13.0–17.0)
Lymphocytes Relative: 33.6 % (ref 12.0–46.0)
Lymphs Abs: 2.1 K/uL (ref 0.7–4.0)
MCHC: 34.2 g/dL (ref 30.0–36.0)
MCV: 94.9 fl (ref 78.0–100.0)
Monocytes Absolute: 0.6 K/uL (ref 0.1–1.0)
Monocytes Relative: 10.1 % (ref 3.0–12.0)
Neutro Abs: 3.3 K/uL (ref 1.4–7.7)
Neutrophils Relative %: 54.1 % (ref 43.0–77.0)
Platelets: 287 K/uL (ref 150.0–400.0)
RBC: 3.99 Mil/uL — ABNORMAL LOW (ref 4.22–5.81)
RDW: 13.7 % (ref 11.5–15.5)
WBC: 6.1 K/uL (ref 4.0–10.5)

## 2025-01-11 LAB — LIPID PANEL
Cholesterol: 177 mg/dL (ref 28–200)
HDL: 41.9 mg/dL
LDL Cholesterol: 121 mg/dL — ABNORMAL HIGH (ref 10–99)
NonHDL: 134.8
Total CHOL/HDL Ratio: 4
Triglycerides: 68 mg/dL (ref 10.0–149.0)
VLDL: 13.6 mg/dL (ref 0.0–40.0)

## 2025-01-11 LAB — PSA: PSA: 0.84 ng/mL (ref 0.10–4.00)

## 2025-01-11 NOTE — Patient Instructions (Signed)
 VISIT SUMMARY: During your annual physical exam, we discussed your overall health, including your diet, exercise, and sleep habits. We also addressed your concerns about pneumonia, lung cancer screening, and nasal obstruction. Routine blood work was performed, and you received a pneumococcal vaccine. We also talked about your history of smoking, gastroesophageal reflux disease, chronic liver disease, and hyperlipidemia.  YOUR PLAN: -ANNUAL PHYSICAL EXAMINATION AND PREVENTIVE CARE: A routine annual physical examination was conducted to assess your overall health. We discussed your diet, exercise, and sleep habits. Basic blood work, including PSA, cholesterol, and blood counts, was performed. Please continue your healthy lifestyle, and we will follow up in one year.  -LUNG CANCER SCREENING: Lung cancer screening is important due to your smoking history. We have reordered a CT scan for lung cancer screening, and you will receive a call to schedule it.  -PNEUMOCOCCAL VACCINATION: Given your history of pneumonia, the pneumococcal vaccine can help reduce your risk of future pneumonia. You received the vaccine today.  -HISTORY OF SMOKING: You quit smoking two years ago, which has positively impacted your overall health and liver function. This is a significant achievement for your health.  -GASTROESOPHAGEAL REFLUX DISEASE: Gastroesophageal reflux disease (GERD) occurs when stomach acid frequently flows back into the tube connecting your mouth and stomach. You are not currently taking Prilosec daily, and no new symptoms were reported.  -CHRONIC LIVER DISEASE: Chronic liver disease can result from long-term damage to the liver. Your past alcohol use likely contributed to this, but quitting drinking for two years has likely improved your liver health.  -HYPERLIPIDEMIA: Hyperlipidemia means having high levels of fats (lipids) in your blood, such as cholesterol. Your daily use of extra virgin olive oil is  beneficial for managing your cholesterol levels. No new symptoms were reported.  -NASAL OBSTRUCTION DUE TO DEVIATED NASAL SEPTUM: A deviated nasal septum can cause nasal obstruction and difficulty breathing. This condition is due to your childhood injury. We discussed the potential benefits of seeing an ENT specialist for possible surgical correction to improve your breathing and sinus function. You will receive a call to schedule an appointment with the ENT specialist.  INSTRUCTIONS: Please follow up in one year for your next annual physical exam. You will receive calls to schedule your CT scan for lung cancer screening and your appointment with the ENT specialist. Continue your healthy lifestyle and take your medications as prescribed.    Managing Your Health Over Time  Managing every aspect of your health in a single visit isn't always feasible, but that's okay.  We addressed many preventive issues today and charted a course for future care. Acute conditions or preventive care measures may require further attention.  We encourage you to schedule a follow-up visit at your earliest convenience to discuss any unresolved issues.  We strongly encourage continued participation in annual preventive care visits to help us  develop a more thorough understanding of your health and to help you maintain optimal wellness - please inquire about scheduling your next one with us  at your earliest convenience.  Next Steps  [x]   Schedule Follow-Up:  We recommend a follow-up appointment in 1 year for your next wellness visit.  If you develop any new problems, want to address any medical issues, or your condition worsens before then, please call us  for an appointment or seek emergency care. [x]   Preventive Care:  Make sure to keep regular appointments with dental and vision professionals, use nightly nasal saline mist sprays to keep your sinuses clear and toothbrushing  to protect your teeth. Use [employer] App or other  app to track your sleep quality. Check blood pressure and heart rate routinely. [x]   Medical Information Release:  For any relevant medical information we don't have, please sign a release form at the front desk so we can obtain it for your records. [x]   Lab Tests:  Schedule any lab tests from today for within a week to ensure best insurance coverage.    Making the Most of Our Focused (20 minute) Appointments:  [x]   Clearly state your top concerns at the beginning of the visit to focus our discussion [x]   If you anticipate you will need more time, please inform the front desk during scheduling - we can book multiple appointments in the same week. [x]   If you have transportation problems- use our convenient video appointments or ask about transportation support. [x]   We can get down to business faster if you use MyChart to update information before the visit and submit non-urgent questions before your visit. Thank you for taking the time to provide details through MyChart.  Let our nurse know and she can import this information into your encounter documents.  Arrival and Wait Times: [x]   Arriving on time ensures that everyone receives prompt attention. [x]   Early morning (8a) and afternoon (1p) appointments tend to have shortest wait times. [x]   Unfortunately, we cannot delay appointments for late arrivals or hold slots during phone calls.  Bring to Your Next Appointment  [x]   Medications: Please bring all your medication bottles to your next appointment to ensure we have an accurate record of your prescriptions. [x]   Health Diaries: If you're monitoring any health conditions at home, keeping a diary of your readings can be very helpful for discussions at your next appointment.  Reviewing Your Records  [x]   Review your attached preventive care information at the end of these patient instructions. [x]   Review this early draft of your clinical encounter notes below and the final encounter  summary tomorrow on MyChart after its been completed.   Encounter for annual general medical examination with abnormal findings in adult -     CT CHEST LUNG CANCER SCREENING LOW DOSE WO CONTRAST; Future -     Pneumococcal conjugate vaccine 20-valent -     Lipid panel -     Comprehensive metabolic panel with GFR -     CBC with Differential/Platelet -     PSA  History of smoking 25-50 pack years Assessment & Plan: Lung cancer screening   Lung cancer screening is due. Previous scheduling attempts were unsuccessful due to postponements. The importance of screening was discussed, given his smoking history. A CT scan for lung cancer screening was reordered, and he will receive a call to schedule it.He quit smoking two years ago, contributing to improved health and liver function. The positive impact of smoking cessation on overall health was discussed.  Orders: -     CT CHEST LUNG CANCER SCREENING LOW DOSE WO CONTRAST; Future  Screening for malignant neoplasm of prostate -     PSA  Nasal obstruction -     Ambulatory referral to ENT  Nasal septal deviation -     Ambulatory referral to ENT  Gastroesophageal reflux disease without esophagitis  Alcoholic cirrhosis of liver with ascites (HCC)  Hyperlipidemia, unspecified hyperlipidemia type     Getting Answers and Following Up  [x]   Simple Questions & Concerns: For quick questions or basic follow-up after your visit, reach us  at [phone]  [phone] or MyChart messaging. [x]   Complex Concerns: If your concern is more complex, scheduling an appointment might be best. Discuss this with the staff to find the most suitable option. [x]   Lab & Imaging Results: We'll contact you directly if results are abnormal or you don't use MyChart. Most normal results will be on MyChart within 2-3 business days, with a review message from Dr. Jesus. Haven't heard back in 2 weeks? Need results sooner? Contact us  at [phone]. [x]   Referrals: Our referral  coordinator will manage specialist referrals. The specialist's office should contact you within 2 weeks to schedule an appointment. Call us  if you haven't heard from them after 2 weeks.  Staying Connected  [x]   MyChart: Activate your MyChart for the fastest way to access results and message us . See the last page of this paperwork for instructions on how to activate.  Billing  [x]   X-ray & Lab Orders: These are billed by separate companies. Contact the invoicing company directly for questions or concerns. [x]   Visit Charges: Discuss any billing inquiries with our administrative services team.  Your Satisfaction Matters  [x]   Share Your Experience: We strive for your satisfaction! If you have any complaints, or preferably compliments, please let Dr. Jesus know directly or contact our Practice Administrators, Manuelita Rubin or [employer], by asking at the front desk.   Medical Screening Exam A medical screening exam (MSE) helps to determine whether you need immediate medical treatment relating to any number of symptoms you are having. This type of exam may be done in an emergency department, an urgent care setting, or your health care provider's office. Depending on your symptoms and severity, you may need additional tests or medical therapy. It is important to note that an MSE does not necessarily mean that you will need or receive further medical testing or interventions if your symptoms are not deemed to be medically urgent (emergent). Tell a health care provider about: Any allergies you have. All medicines you are taking, including vitamins, herbs, eye drops, creams, and over-the-counter medicines. Any problems you or family members have had with anesthetic medicines. Any bleeding problems you have. Any surgeries you have had. Any medical conditions you have. Whether you are pregnant or may be pregnant. What happens during the test? During the exam, a health care provider does a  short, often focused, physical exam and asks about your medical history to assess: Your current symptoms. Your overall health. Your need for possible further medical intervention. What can I expect after the test? If you have a regular health care provider, make an appointment for a follow-up visit with him or her. If you do not have a regular health care provider, ask about resources in your community. Your medical screening exam may determine that: You do not need emergency treatment at this time. You need treatment right away. You need to be transferred to another medical center. This may happen if you need an emergent specialist or consultant that is not available at the medical center you are at. You need to have more tests. A medical specialist may be consulted if needed. Get help right away if: Your condition gets worse. You develop new or troubling symptoms before you see your health care provider. These symptoms may represent a serious problem that is an emergency. Do not wait to see if the symptoms will go away. Get medical help right away. Call your local emergency services (911 in the [place] Do not drive yourself to the  hospital. Summary A medical screening exam helps to determine whether you need medical treatment right away. This type of exam may be done in an emergency department, an urgent care setting, or your health care provider's office. During the exam, a health care provider does a short physical exam and asks about your current symptoms and overall health. Depending on the exam, more tests or therapies may be ordered. However, an MSE does not necessarily mean that you will have further medical testing if your symptoms are not deemed to be urgent. If you need further care that is not offered at your current medical center, you may need to be transferred to another facility. This information is not intended to replace advice given to you by your health care provider. Make  sure you discuss any questions you have with your health care provider. Document Revised: 08/21/2021 Document Reviewed: 04/18/2021 Elsevier Patient Education  2024 [employer].

## 2025-01-11 NOTE — Progress Notes (Signed)
 " [facility] at [facility] [address] [place], [place] [address] Office:  [phone]  -- Annual Preventive Medical Office Visit --  Patient:  Derek Bullock      Age: 64 y.o.       Sex:  male  Date:   01/11/2025 Patient Care Team: Jesus Bernardino MATSU, MD as PCP - General (Internal Medicine) Fernande Elspeth BROCKS, MD (Inactive) as Consulting Physician (Cardiology) Today's Healthcare Provider: Bernardino MATSU Jesus, MD  ========================================= Chief complaint: Annual Exam and Skin Problem (Skin spot on the right shoulder )  Purpose of Visit: Comprehensive preventive health assessment and personalized health maintenance planning.  This encounter was conducted as a Comprehensive Physical Exam (CPE) preventive care annual visit. The patient's medical history and problem list were reviewed to inform individualized preventive care recommendations.   No problem-specific medical treatment was provided during this visit. Assessment & Plan Encounter for annual general medical examination with abnormal findings in adult Comprehensive Preventive Examination (Annual Wellness Visit) Performed Today.  Abridge Summary:  Annual physical examination and preventive care   A routine annual physical examination was conducted. Diet, exercise, and sleep habits were discussed. He maintains a healthy lifestyle with minimal medication use and regular multivitamin intake. Basic blood work, including PSA, cholesterol, and blood counts, was performed. A one-year follow-up appointment was scheduled.Pneumococcal vaccination   He has had pneumonia six times. The benefits of the pneumococcal vaccine in reducing pneumonia risk were discussed. Despite concerns about potential side effects, he agreed to receive the vaccine, which was administered. ?? Core Review: Interval HPI/ROS reviewed. History, current Medications (including reconciliation), known Allergies, and Immunization Status were  thoroughly reviewed and updated. ?? Risk Assessment: Comprehensive Family, Social (including tobacco/alcohol/substance use), and Safety/Functional risks were assessed. Vitals and a complete, age-appropriate Physical Exam were fully documented. ? Preventive Planning: I reviewed all appropriate preventive screening tests (e.g., mammogram, colonoscopy, cervical cancer screening) and immunization needs (e.g., influenza, pneumococcal, Zoster). ??? Counseling & Education: Provided individualized counseling on Healthy Diet (e.g., DASH/Mediterranean), Regular Physical Activity (consistent with CDC guidelines), Sleep Hygiene, Stress Management strategies, and Mental Health screening results. ?? Conclusion: Shared decision-making was performed for all recommended interventions. Orders were placed for indicated screenings and/or vaccinations. Written educational materials were provided to reinforce today's counseling and plan History of smoking 25-50 pack years Lung cancer screening   Lung cancer screening is due. Previous scheduling attempts were unsuccessful due to postponements. The importance of screening was discussed, given his smoking history. A CT scan for lung cancer screening was reordered, and he will receive a call to schedule it.He quit smoking two years ago, contributing to improved health and liver function. The positive impact of smoking cessation on overall health was discussed. Screening for malignant neoplasm of prostate Shared decision-making done; patient understood rationale and agreed to labwork  Nasal obstruction Nasal septal deviation He reports nasal obstruction due to a deviated septum from a childhood injury. The potential benefits of ENT evaluation for possible surgical correction to improve breathing and sinus function were discussed. He was referred to ENT for evaluation, and he will receive a call to schedule the appointment. Gastroesophageal reflux disease without esophagitis He  is not currently taking Prilosec daily, and no new symptoms were reported. Alcoholic cirrhosis of liver with ascites (HCC) Likely related to past alcohol use. He has quit drinking for two years, likely contributing to liver health improvement. Hyperlipidemia, unspecified hyperlipidemia type He takes extra virgin olive oil daily, which is beneficial for cholesterol  management. No new symptoms were reported.   Reviewed/updated/encouraged completion: Immunization History  Administered Date(s) Administered   PFIZER(Purple Top)SARS-COV-2 Vaccination 02/19/2020, 03/21/2020   Tdap 06/21/2015   Health Maintenance Due  Topic Date Due   Pneumococcal Vaccine: 50+ Years (1 of 2 - PCV) Never done   Lung Cancer Screening  Never done   Hepatitis B Vaccines 19-59 Average Risk (1 of 3 - Risk 3-dose series) Never done   Colonoscopy  04/10/2024   Health Maintenance  Topic Date Due   Pneumococcal Vaccine: 50+ Years (1 of 2 - PCV) Never done   Lung Cancer Screening  Never done   Hepatitis B Vaccines 19-59 Average Risk (1 of 3 - Risk 3-dose series) Never done   Colonoscopy  04/10/2024   DTaP/Tdap/Td (2 - Td or Tdap) 06/20/2025   HPV VACCINES (No Doses Required) Completed   Hepatitis C Screening  Completed   HIV Screening  Completed   Meningococcal B Vaccine  Aged Out   Influenza Vaccine  Discontinued   COVID-19 Vaccine  Discontinued   Zoster Vaccines- Shingrix  Discontinued    Reviewed the following verbally with patient and provided AVS materials:  HEALTH MAINTENANCE COUNSELING AND ANTICIPATORY GUIDANCE    Preventive Measure Recommendation  Eye Exams Every 1-2 years  Dental Care Cleanings every 6 months or more, brush/floss 3x daily  Sinus Care Saline spray rinses daily  Sleep 8 hours nightly, good sleep hygiene, e-monitoring if any daytime drowsiness  Diet Fruits/vegetables/fiber/healthy fats, balance and moderation  Exercise 150 minutes weekly  Risk Behaviors Discouraged any/all high risk  behaviors    CANCER SCREENING SHARED DECISION MAKING    Penile/Testicle/Scrotum Encouraged self-monitoring and reporting of genital abnormalities. Patient reports none.  Thyroid Thyroid was palpated for nodules today.  Prostate Individualized risks/benefits/costs discussed  No results found for: PSA  Colon HM Colonoscopy          Current Care Gaps     Colonoscopy (Yearly) Overdue since 04/10/2024    04/11/2023  Done - cologuard negative  Only the first 1 history entries have been loaded, but more history exists.             Reports there is no blood in stools  Lung Current guidelines recommend individuals aged 72 to 22 who currently smoke or formerly smoked and have a >= 20 pack-year smoking history should undergo annual screening with low-dose computed tomography (LDCT). Tobacco Use: Medium Risk (01/11/2025)   Patient History    Smoking Tobacco Use: Former    Smokeless Tobacco Use: Never    Passive Exposure: Not on file  Tobacco Use History[1]  Skin Advised regular sunscreen use. Patient denies worrisome, changing, or new skin lesions. Offered to include images in chart for surveillance. Showed patient these pictures of melanomas for reference to educate for self-monitoring.  Other Cancers Discussed lack of screening guidelines and insurance coverage for other cancer types.  Discussed the use of AI scribe software for clinical note transcription with the patient, who gave verbal consent to proceed. History of Present Illness 64 year old male who presents for an annual physical exam.  He has a hoarse throat, which he attributes to the weather, and does not express concern about this symptom. He mentions a spot on his shoulder, which he plans to have evaluated by his dermatologist. He has a history of skin issues and sees a skin specialist.  He is currently taking multivitamins, Prilosec for his stomach, and occasionally uses Voltaren  gel for arthritis and a  muscle relaxer  for muscle spasms. He has stopped using creams for a previous fungal infection and is not taking B12 injections.  He has a history of pneumonia, having had it six times in his life, and expresses concern about this. He quit drinking alcohol two years ago, which he believes has contributed to his improved health. He reports a significant reduction in his 'beer gut'.  He has a history of a broken nose from childhood, which affects his breathing. He experiences nasal blockage and occasional difficulty breathing through one nostril. No blood in stool, blood in urine or semen, or lower urinary tract symptoms, although he occasionally wakes at night to urinate.  He maintains a healthy lifestyle, eating oatmeal every morning, avoiding junk food, and using extra virgin olive oil daily. He does not exercise much but stays active around the house.  He has a history of being allergic to denture adhesive and is considering dental implants.  ROS A comprehensive ROS was negative for any concerning symptoms not already mentioned in HPI.  Completed medication reconciliation: Current Outpatient Medications on File Prior to Visit  Medication Sig   famotidine -calcium  carbonate-magnesium  hydroxide (PEPCID  COMPLETE) [phone] MG chewable tablet Chew 1 tablet by mouth daily as needed (take these only as needed for heartburn acid symptoms).   fluocinonide  ointment (LIDEX ) 0.05 % Apply 1 Application topically 2 (two) times daily as needed. To lip as needed   Multiple Vitamins-Minerals (ONE DAILY FOR MEN 50+ ADVANCED) TABS Take 1 tablet by mouth daily.   multivitamin (ONE-A-DAY MEN'S) TABS tablet Take 1 tablet by mouth daily.   omeprazole  (PRILOSEC) 20 MG capsule TAKE 1 CAPSULE(20 MG) BY MOUTH DAILY   thiamine  (VITAMIN B1) 100 MG tablet Take 1 tablet (100 mg total) by mouth daily.   Camphor-Menthol-Methyl Sal (SALONPAS ) 3.12-27-08 % PTCH Apply 1 Act topically daily at 6 (six) AM.   ciclopirox  (LOPROX ) 0.77 % cream Apply  topically 2 (two) times daily.   cyanocobalamin  (VITAMIN B12) 1000 MCG/ML injection Inject into the muscle. (Patient not taking: Reported on 01/11/2025)   diclofenac  Sodium (VOLTAREN ) 1 % GEL Apply 4 g topically 4 (four) times daily as needed. (Patient not taking: Reported on 01/11/2025)   methocarbamol  (ROBAXIN ) 500 MG tablet Take 1 tablet (500 mg total) by mouth every 6 (six) hours as needed for muscle spasms. Muscle relaxer for back pain   No current facility-administered medications on file prior to visit.  There are no discontinued medications.The following were reviewed and/or entered/updated into our electronic MEDICAL RECORD NUMBERPast Medical History:  Diagnosis Date   Adenomatous colon polyp    Alcohol dependence with other alcohol-induced disorder (HCC) 11/24/2022   He initially had a protuberant abdomen with fluid shift and asterixis I informed him this is concerning for advanced liver disease but after stopping drinking all these problems seemed to dramatically improve  He had elevated ast/alternative (alcoholic hepatitis)      Arrhythmia 12/22/2005   Arthritis    Bilateral impacted cerumen 11/24/2022   Cerumen impaction 12/31/2022   He has ringing in the ear ear and persistently impacted cerumen that did not resolve with Debrox treatment although it did soften up   Cirrhosis of liver (HCC) 11/24/2022   Presumed alcohol associated with gynecomastia  Speculative diagnosis based on stigmata (gynecomastia, asterixis, protuberant abdomen presumed ascitic)- all these problem resolved with alcohol made a personal choice to discontinue   Current status: resolving Contributing factors: History of alcohol use, now abstinent x 1 year  Current management: Alcohol  abstinence Vitamin B12 supplementation   Diverticulosis    GERD (gastroesophageal reflux disease)    Gynecomastia, male 05/11/2015   Suspicious liver disease induced  Has discontinued all alcohol at beginning of 2024     Hemorrhoid  12/22/1998   s/p banding    Hyperlipidemia    Internal and external hemorrhoids without complication    Limping 11/24/2022   Keeps left leg straight when walking- says the problem is in the hip though the knee doesn't bend and there is no pain in either.   Lip lesion 09/08/2023   Lesion of lip (ICD-10: R23.8) - New: Suspicious lesion on lip noted. Dermatology appointment scheduled for late October for evaluation of possible skin cancer. Patient advised to monitor for changes and seek earlier evaluation if rapid growth occurs.     RUQ pain 03/06/2023   Has been improving   Does wrap around to kidney area  Came and gone off/on since 2022  He reports it does NOT associated with constipation, diarrhea, nausea or any other symptom(s)      Tinnitus of right ear 11/24/2022   Associated with cerumen impaction   Tobacco abuse 11/22/2012   Smokes 1/3 ppd patient reports 11/24/22  Discontinued cly 1 week prior to 12/31/22 appointment      Past Surgical History:  Procedure Laterality Date   CARDIOVERSION  2007   KNEE SURGERY Left 1996   Social History   Socioeconomic History   Marital status: Divorced    Spouse name: Not on file   Number of children: 0   Years of education: 11   Highest education level: Not on file  Occupational History   Occupation: Unemployed  Tobacco Use   Smoking status: Former    Current packs/day: 0.00    Average packs/day: 0.9 packs/day for 39.6 years (33.9 ttl pk-yrs)    Types: Cigarettes    Start date: 21    Quit date: 07/2023    Years since quitting: 1.4   Smokeless tobacco: Never  Vaping Use   Vaping status: Never Used  Substance and Sexual Activity   Alcohol use: Yes    Alcohol/week: 4.0 standard drinks of alcohol    Types: 4 Cans of beer per week    Comment: 80 0z of beer 3 times a week    Drug use: Yes    Types: Marijuana    Comment: Smoked mariijuana last night   Sexual activity: Not Currently  Other Topics Concern   Not on file  Social History  Narrative   Mom and brother    Social Drivers of Health   Tobacco Use: Medium Risk (01/11/2025)   Patient History    Smoking Tobacco Use: Former    Smokeless Tobacco Use: Never    Passive Exposure: Not on Actuary Strain: Not on file  Food Insecurity: Not on file  Transportation Needs: Not on file  Physical Activity: Not on file  Stress: Not on file  Social Connections: Unknown (08/27/2022)   Received from [facility]   Social Network    Social Network: Not on file  Intimate Partner Violence: Unknown (08/27/2022)   Received from [facility]   HITS    Physically Hurt: Not on file    Insult or Talk Down To: Not on file    Threaten Physical Harm: Not on file    Scream or Curse: Not on file  Depression (PHQ2-9): Low Risk (01/11/2025)   Depression (PHQ2-9)    PHQ-2 Score: 0  Alcohol  Screen: Not on file  Housing: Not on file  Utilities: Not on file  Health Literacy: Not on file    Family History  Problem Relation Age of Onset   Arthritis Mother    Diabetes Mother    Heart disease Mother    Heart attack Mother    Cancer Father        kidney    Heart disease Father    Glaucoma Father    Kidney disease Father    Colon polyps Brother    Colon cancer Paternal Grandmother    Esophageal cancer Neg Hx    Rectal cancer Neg Hx    Stomach cancer Neg Hx   Allergies[2] Social History   Substance and Sexual Activity  Sexual Activity Not Currently  @    01/11/2025    9:20 AM  Depression screen PHQ 2/9  Decreased Interest 0  Down, Depressed, Hopeless 0  PHQ - 2 Score 0      01/11/2025    9:21 AM  Fall Risk   Falls in the past year? 0  Number falls in past yr: 0  Injury with Fall? 0  Risk for fall due to : No Fall Risks     BP 120/62   Pulse 94   Temp 98 F (36.7 C) (Temporal)   Ht 6' 1 (1.854 m)   Wt 166 lb (75.3 kg)   SpO2 98%   BMI 21.90 kg/m  BP Readings from Last 3 Encounters:  01/11/25 120/62  08/09/24 120/60  08/08/24 113/68   Wt  Readings from Last 10 Encounters:  01/11/25 166 lb (75.3 kg)  08/09/24 165 lb 3.2 oz (74.9 kg)  05/09/24 165 lb 12.8 oz (75.2 kg)  02/09/24 170 lb 12.8 oz (77.5 kg)  11/09/23 168 lb (76.2 kg)  09/07/23 168 lb 6.4 oz (76.4 kg)  07/27/23 168 lb 12.8 oz (76.6 kg)  03/27/23 175 lb 3.2 oz (79.5 kg)  03/06/23 179 lb 6.4 oz (81.4 kg)  02/04/23 182 lb 12.8 oz (82.9 kg)  Physical Exam Physical Exam HEENT: Normal oropharynx, nasal obstruction due to deviated septum, hearing grossly intact. CHEST: Clear to auscultation bilaterally. SKIN: Skin lesion on shoulder appears benign. Photographs Taken 01/11/2025 :    GEN: No acute distress, resting comfortably. HEENT: Tympanic membranes normal appearing bilaterally, oropharynx clear, no thyromegaly noted, no palpable lymphadenopathy or thyroid nodules. CARDIOVASCULAR: S1 and S2 heart sounds with regular rate and rhythm, no murmurs appreciated. PULMONARY: Normal work of breathing, clear to auscultation bilaterally, no crackles, wheezes, or rhonchi. ABDOMEN: Soft, nontender, nondistended. MSK: No edema, cyanosis, or clubbing noted. SKIN: Warm, dry, no lesions of concern observed. NEUROLOGICAL: Cranial nerves II-XII grossly intact, strength 5/5 in upper and lower extremitiesPSYCH: Normal affect and thought content, pleasant and cooperative.  Last CBC Lab Results  Component Value Date   WBC 7.3 05/09/2024   HGB 12.8 (L) 05/09/2024   HCT 37.6 (L) 05/09/2024   MCV 93.3 05/09/2024   MCH 32.1 09/07/2023   RDW 13.6 05/09/2024   PLT 300.0 05/09/2024   Last metabolic panel Lab Results  Component Value Date   GLUCOSE 105 (H) 05/09/2024   NA 141 05/09/2024   K 3.8 05/09/2024   CL 107 05/09/2024   CO2 29 05/09/2024   BUN 10 05/09/2024   CREATININE 0.76 05/09/2024   GFR 95.75 05/09/2024   CALCIUM  9.1 05/09/2024   PROT 6.2 05/09/2024   ALBUMIN 4.0 05/09/2024   BILITOT 0.4 05/09/2024   ALKPHOS 80 05/09/2024  AST 16 05/09/2024   ALT 12 05/09/2024    Last lipids Lab Results  Component Value Date   CHOL 158 05/11/2024   HDL 39.50 05/11/2024   LDLCALC 101 (H) 05/11/2024   TRIG 86.0 05/11/2024   CHOLHDL 4 05/11/2024   Last hemoglobin A1c Lab Results  Component Value Date   HGBA1C 4.9 02/18/2016   Last thyroid functions Lab Results  Component Value Date   TSH 1.950 09/07/2023   Last vitamin D No results found for: 25OHVITD2, 25OHVITD3, VD25OH Last vitamin B12 and Folate Lab Results  Component Value Date   VITAMINB12 404 09/07/2023   FOLATE >24.2 09/07/2023        ======================================  IMPORTANT HEALTH REMINDERS: Report any new or changing skin lesions promptly Maintain recommended screening schedules Discuss any new family history of cancer at future visits Follow up on any new symptoms that persist more than two weeks      Notes:  This document was synthesized by artificial intelligence (Abridge) using HIPAA-compliant recording of the clinical interaction;   We discussed the use of AI scribe software for clinical note transcription with the patient, who gave verbal consent to proceed.    This encounter employed state-of-the-art, real-time, collaborative documentation. The patient was empowered to actively review and assist in updating their electronic medical record on a shared monitor, ensuring transparency and improving accuracy.    Prior to and at the beginning of Comprehensive Physical Exam (CPE) preventive care annual visit appointment types  we clarify to patients Our goal today is to focus on your preventive or annual Comprehensive Physical Exam (CPE) preventive care annual visit, which typically covers routine screenings and overall health maintenance. However, if you share any new or concerning symptoms--such as dizziness, passing out, severe pain, or anything else that may point to a more serious issue--we are both legally and ethically required to evaluate it. We cannot simply  overlook or ignore such concerns, even if you later decide you dont want to discuss them, because it could jeopardize your health.  If addressing a new concern takes us  beyond the scope of the preventive visit, we may need to bill separately for that portion of care. We understand financial considerations are important, and were happy to discuss your options if something new comes up. However, we want to be clear that once you mention a potentially serious issue, we must investigate it; we cant ethically or legally exclude that from our records or our evaluation. Please let us  know all of your questions or worries. Together, we can decide how best to manage them and how to minimize any unexpected costs, but we want to keep you safe above all else.   This disclosure is mandated by professional ethics and legal obligations, as healthcare providers must address any substantial health concerns raised during any patient interaction and a comprehensive ROS is required by insurance companies for billing preventive-care visit type.   This disclosure ultimately discourages patients financially from reporting significant health issues.   Medical Screening Exam A medical screening exam (MSE) helps to determine whether you need immediate medical treatment relating to any number of symptoms you are having. This type of exam may be done in an emergency department, an urgent care setting, or your health care provider's office. Depending on your symptoms and severity, you may need additional tests or medical therapy. It is important to note that an MSE does not necessarily mean that you will need or receive further medical testing or interventions if  your symptoms are not deemed to be medically urgent (emergent). Tell a health care provider about: Any allergies you have. All medicines you are taking, including vitamins, herbs, eye drops, creams, and over-the-counter medicines. Any problems you or family members have  had with anesthetic medicines. Any bleeding problems you have. Any surgeries you have had. Any medical conditions you have. Whether you are pregnant or may be pregnant. What happens during the test? During the exam, a health care provider does a short, often focused, physical exam and asks about your medical history to assess: Your current symptoms. Your overall health. Your need for possible further medical intervention. What can I expect after the test? If you have a regular health care provider, make an appointment for a follow-up visit with him or her. If you do not have a regular health care provider, ask about resources in your community. Your medical screening exam may determine that: You do not need emergency treatment at this time. You need treatment right away. You need to be transferred to another medical center. This may happen if you need an emergent specialist or consultant that is not available at the medical center you are at. You need to have more tests. A medical specialist may be consulted if needed. Get help right away if: Your condition gets worse. You develop new or troubling symptoms before you see your health care provider. These symptoms may represent a serious problem that is an emergency. Do not wait to see if the symptoms will go away. Get medical help right away. Call your local emergency services (911 in the [place] Do not drive yourself to the hospital. Summary A medical screening exam helps to determine whether you need medical treatment right away. This type of exam may be done in an emergency department, an urgent care setting, or your health care provider's office. During the exam, a health care provider does a short physical exam and asks about your current symptoms and overall health. Depending on the exam, more tests or therapies may be ordered. However, an MSE does not necessarily mean that you will have further medical testing if your symptoms are not  deemed to be urgent. If you need further care that is not offered at your current medical center, you may need to be transferred to another facility. This information is not intended to replace advice given to you by your health care provider. Make sure you discuss any questions you have with your health care provider. Document Revised: 08/21/2021 Document Reviewed: 04/18/2021 Elsevier Patient Education  2024 [employer] Inc[employer]   Health Maintenance, Male Adopting a healthy lifestyle and getting preventive care are important in promoting health and wellness. Ask your health care provider about: The right schedule for you to have regular tests and exams. Things you can do on your own to prevent diseases and keep yourself healthy. What should I know about diet, weight, and exercise? Eat a healthy diet  Eat a diet that includes plenty of vegetables, fruits, low-fat dairy products, and lean protein. Do not eat a lot of foods that are high in solid fats, added sugars, or sodium. Maintain a healthy weight Body mass index (BMI) is a measurement that can be used to identify possible weight problems. It estimates body fat based on height and weight. Your health care provider can help determine your BMI and help you achieve or maintain a healthy weight. Get regular exercise Get regular exercise. This is one of the most important things you can do  for your health. Most adults should: Exercise for at least 150 minutes each week. The exercise should increase your heart rate and make you sweat (moderate-intensity exercise). Do strengthening exercises at least twice a week. This is in addition to the moderate-intensity exercise. Spend less time sitting. Even light physical activity can be beneficial. Watch cholesterol and blood lipids Have your blood tested for lipids and cholesterol at 64 years of age, then have this test every 5 years. You may need to have your cholesterol levels checked more often if: Your  lipid or cholesterol levels are high. You are older than 64 years of age. You are at high risk for heart disease. What should I know about cancer screening? Many types of cancers can be detected early and may often be prevented. Depending on your health history and family history, you may need to have cancer screening at various ages. This may include screening for: Colorectal cancer. Prostate cancer. Skin cancer. Lung cancer. What should I know about heart disease, diabetes, and high blood pressure? Blood pressure and heart disease High blood pressure causes heart disease and increases the risk of stroke. This is more likely to develop in people who have high blood pressure readings or are overweight. Talk with your health care provider about your target blood pressure readings. Have your blood pressure checked: Every 3-5 years if you are 68-63 years of age. Every year if you are 59 years old or older. If you are between the ages of 41 and 19 and are a current or former smoker, ask your health care provider if you should have a one-time screening for abdominal aortic aneurysm (AAA). Diabetes Have regular diabetes screenings. This checks your fasting blood sugar level. Have the screening done: Once every three years after age 35 if you are at a normal weight and have a low risk for diabetes. More often and at a younger age if you are overweight or have a high risk for diabetes. What should I know about preventing infection? Hepatitis B If you have a higher risk for hepatitis B, you should be screened for this virus. Talk with your health care provider to find out if you are at risk for hepatitis B infection. Hepatitis C Blood testing is recommended for: Everyone born from [birth year] through 1965. Anyone with known risk factors for hepatitis C. Sexually transmitted infections (STIs) You should be screened each year for STIs, including gonorrhea and chlamydia, if: You are sexually active and  are younger than 64 years of age. You are older than 64 years of age and your health care provider tells you that you are at risk for this type of infection. Your sexual activity has changed since you were last screened, and you are at increased risk for chlamydia or gonorrhea. Ask your health care provider if you are at risk. Ask your health care provider about whether you are at high risk for HIV. Your health care provider may recommend a prescription medicine to help prevent HIV infection. If you choose to take medicine to prevent HIV, you should first get tested for HIV. You should then be tested every 3 months for as long as you are taking the medicine. Follow these instructions at home: Alcohol use Do not drink alcohol if your health care provider tells you not to drink. If you drink alcohol: Limit how much you have to 0-2 drinks a day. Know how much alcohol is in your drink. In the [place] one drink equals one 12 oz  bottle of beer (355 mL), one 5 oz glass of wine (148 mL), or one 1 oz glass of hard liquor (44 mL). Lifestyle Do not use any products that contain nicotine or tobacco. These products include cigarettes, chewing tobacco, and vaping devices, such as e-cigarettes. If you need help quitting, ask your health care provider. Do not use street drugs. Do not share needles. Ask your health care provider for help if you need support or information about quitting drugs. General instructions Schedule regular health, dental, and eye exams. Stay current with your vaccines. Tell your health care provider if: You often feel depressed. You have ever been abused or do not feel safe at home. Summary Adopting a healthy lifestyle and getting preventive care are important in promoting health and wellness. Follow your health care provider's instructions about healthy diet, exercising, and getting tested or screened for diseases. Follow your health care provider's instructions on monitoring your  cholesterol and blood pressure. This information is not intended to replace advice given to you by your health care provider. Make sure you discuss any questions you have with your health care provider. Document Revised: 04/29/2021 Document Reviewed: 04/29/2021 Else[employer] Patient Education  2024 [employer]     [1]  Social History Tobacco Use  Smoking Status Former   Current packs/day: 0.00   Average packs/day: 0.9 packs/day for 39.6 years (33.9 ttl pk-yrs)   Types: Cigarettes   Start date: 60   Quit date: 07/2023   Years since quitting: 1.4  Smokeless Tobacco Never  [2]  Allergies Allergen Reactions   Codeine Rash   "

## 2025-01-11 NOTE — Assessment & Plan Note (Addendum)
 Lung cancer screening   Lung cancer screening is due. Previous scheduling attempts were unsuccessful due to postponements. The importance of screening was discussed, given his smoking history. A CT scan for lung cancer screening was reordered, and he will receive a call to schedule it.He quit smoking two years ago, contributing to improved health and liver function. The positive impact of smoking cessation on overall health was discussed.

## 2025-01-11 NOTE — Assessment & Plan Note (Signed)
 He is not currently taking Prilosec daily, and no new symptoms were reported.

## 2025-01-13 ENCOUNTER — Ambulatory Visit: Admitting: Internal Medicine

## 2025-01-14 ENCOUNTER — Ambulatory Visit: Payer: Self-pay | Admitting: Internal Medicine

## 2025-01-14 NOTE — Progress Notes (Signed)
 Notify normal

## 2025-01-16 NOTE — Progress Notes (Signed)
 Tried to call pt no able to get thru or to leave message.

## 2025-01-19 ENCOUNTER — Other Ambulatory Visit

## 2025-02-01 ENCOUNTER — Other Ambulatory Visit

## 2025-02-08 ENCOUNTER — Ambulatory Visit: Admitting: Dermatology

## 2025-02-28 ENCOUNTER — Institutional Professional Consult (permissible substitution) (INDEPENDENT_AMBULATORY_CARE_PROVIDER_SITE_OTHER): Admitting: Otolaryngology

## 2026-01-15 ENCOUNTER — Encounter: Admitting: Internal Medicine
